# Patient Record
Sex: Male | Born: 1960 | Race: White | Hispanic: No | Marital: Married | Smoking: Never smoker
Health system: Southern US, Community
[De-identification: ages and names within clinical notes are randomized; demographics above are authoritative.]

## PROBLEM LIST (undated history)

## (undated) DIAGNOSIS — E119 Type 2 diabetes mellitus without complications: Secondary | ICD-10-CM

## (undated) DIAGNOSIS — I10 Essential (primary) hypertension: Secondary | ICD-10-CM

## (undated) DIAGNOSIS — G4733 Obstructive sleep apnea (adult) (pediatric): Secondary | ICD-10-CM

## (undated) DIAGNOSIS — T7840XA Allergy, unspecified, initial encounter: Secondary | ICD-10-CM

## (undated) DIAGNOSIS — M1990 Unspecified osteoarthritis, unspecified site: Secondary | ICD-10-CM

## (undated) DIAGNOSIS — Z9989 Dependence on other enabling machines and devices: Secondary | ICD-10-CM

## (undated) HISTORY — PX: NASAL SEPTUM SURGERY: SHX37

## (undated) HISTORY — DX: Obstructive sleep apnea (adult) (pediatric): G47.33

## (undated) HISTORY — DX: Unspecified osteoarthritis, unspecified site: M19.90

## (undated) HISTORY — DX: Dependence on other enabling machines and devices: Z99.89

## (undated) HISTORY — PX: ROTATOR CUFF REPAIR: SHX139

## (undated) HISTORY — DX: Allergy, unspecified, initial encounter: T78.40XA

## (undated) HISTORY — DX: Essential (primary) hypertension: I10

## (undated) HISTORY — PX: ANKLE FRACTURE SURGERY: SHX122

---

## 1998-07-09 ENCOUNTER — Emergency Department (HOSPITAL_COMMUNITY): Admission: EM | Admit: 1998-07-09 | Discharge: 1998-07-10 | Payer: Self-pay | Admitting: Internal Medicine

## 2001-03-29 ENCOUNTER — Emergency Department (HOSPITAL_COMMUNITY): Admission: EM | Admit: 2001-03-29 | Discharge: 2001-03-29 | Payer: Self-pay | Admitting: Emergency Medicine

## 2002-11-17 ENCOUNTER — Emergency Department (HOSPITAL_COMMUNITY): Admission: EM | Admit: 2002-11-17 | Discharge: 2002-11-17 | Payer: Self-pay | Admitting: Emergency Medicine

## 2002-11-17 ENCOUNTER — Encounter: Payer: Self-pay | Admitting: Emergency Medicine

## 2004-07-04 ENCOUNTER — Emergency Department (HOSPITAL_COMMUNITY): Admission: EM | Admit: 2004-07-04 | Discharge: 2004-07-04 | Payer: Self-pay | Admitting: Emergency Medicine

## 2010-08-07 ENCOUNTER — Encounter: Admission: RE | Admit: 2010-08-07 | Discharge: 2010-08-07 | Payer: Self-pay | Admitting: Internal Medicine

## 2011-01-22 HISTORY — PX: ROTATOR CUFF REPAIR: SHX139

## 2011-01-29 ENCOUNTER — Emergency Department (HOSPITAL_COMMUNITY)
Admission: EM | Admit: 2011-01-29 | Discharge: 2011-01-30 | Payer: Self-pay | Source: Home / Self Care | Admitting: Emergency Medicine

## 2011-01-29 LAB — CBC
HCT: 46.2 % (ref 39.0–52.0)
Hemoglobin: 15.7 g/dL (ref 13.0–17.0)
MCH: 31.8 pg (ref 26.0–34.0)
MCHC: 34 g/dL (ref 30.0–36.0)
MCV: 93.7 fL (ref 78.0–100.0)
Platelets: 246 10*3/uL (ref 150–400)
RBC: 4.93 MIL/uL (ref 4.22–5.81)
RDW: 12.8 % (ref 11.5–15.5)
WBC: 10.8 10*3/uL — ABNORMAL HIGH (ref 4.0–10.5)

## 2011-01-29 LAB — POCT CARDIAC MARKERS
CKMB, poc: 2.1 ng/mL (ref 1.0–8.0)
Myoglobin, poc: 62.2 ng/mL (ref 12–200)
Troponin i, poc: <0.05 ng/mL (ref 0.00–0.09)

## 2011-01-29 LAB — BASIC METABOLIC PANEL
CO2: 26 mEq/L (ref 19–32)
Calcium: 9.6 mg/dL (ref 8.4–10.5)
Creatinine, Ser: 0.93 mg/dL (ref 0.4–1.5)
Glucose, Bld: 202 mg/dL — ABNORMAL HIGH (ref 70–99)
Sodium: 142 mEq/L (ref 135–145)

## 2011-01-29 LAB — DIFFERENTIAL
Basophils Absolute: 0 10*3/uL (ref 0.0–0.1)
Basophils Relative: 0 % (ref 0–1)
Eosinophils Absolute: 0.5 10*3/uL (ref 0.0–0.7)
Eosinophils Relative: 5 % (ref 0–5)
Lymphocytes Relative: 15 % (ref 12–46)
Lymphs Abs: 1.7 10*3/uL (ref 0.7–4.0)
Monocytes Absolute: 0.9 10*3/uL (ref 0.1–1.0)
Monocytes Relative: 8 % (ref 3–12)
Neutro Abs: 7.7 10*3/uL (ref 1.7–7.7)
Neutrophils Relative %: 71 % (ref 43–77)

## 2011-05-11 ENCOUNTER — Other Ambulatory Visit: Payer: Self-pay | Admitting: Gastroenterology

## 2011-08-17 ENCOUNTER — Ambulatory Visit (AMBULATORY_SURGERY_CENTER): Payer: 59 | Admitting: *Deleted

## 2011-08-17 ENCOUNTER — Encounter: Payer: Self-pay | Admitting: Gastroenterology

## 2011-08-17 VITALS — Ht 71.0 in | Wt 223.6 lb

## 2011-08-17 DIAGNOSIS — Z1211 Encounter for screening for malignant neoplasm of colon: Secondary | ICD-10-CM

## 2011-08-17 MED ORDER — PEG-KCL-NACL-NASULF-NA ASC-C 100 G PO SOLR: ORAL | Status: DC

## 2011-08-31 ENCOUNTER — Other Ambulatory Visit: Payer: Self-pay | Admitting: Gastroenterology

## 2011-12-20 ENCOUNTER — Ambulatory Visit: Payer: Self-pay

## 2012-01-20 IMAGING — CT CT CERVICAL SPINE W/O CM
3 of 4 series · 16 of 28 positions shown, 18 images · non-contrast
Comparison: None.

CLINICAL DATA: Neck pain, paresthesias

CT CERVICAL SPINE WITHOUT CONTRAST
TECHNIQUE: Multidetector CT imaging of the cervical spine was
performed. Multiplanar CT image reconstructions were also
generated.

[Series 2: c spine bone · axial · 0.27mm/px · z∈[-32,+101]mm · 5 of 81 slices shown, 7 images]
[im 14/81  soft-tissue]
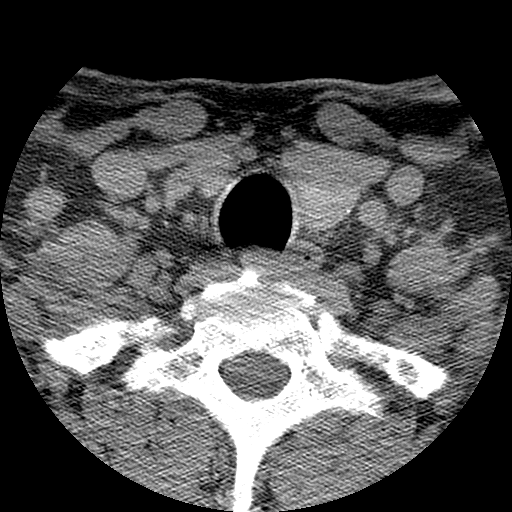
[im 14/81  bone]
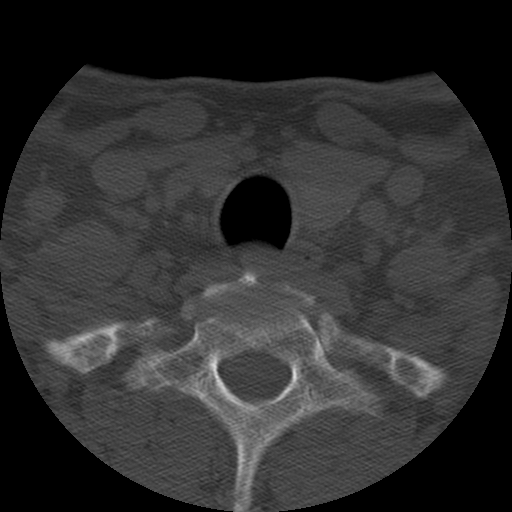
[im 27/81  bone]
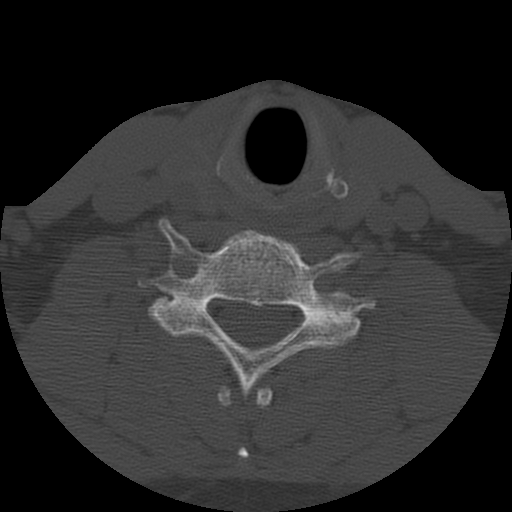
[im 41/81  bone]
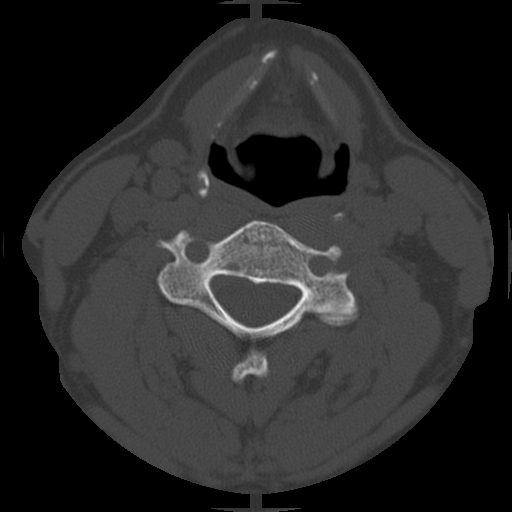
[im 54/81  bone]
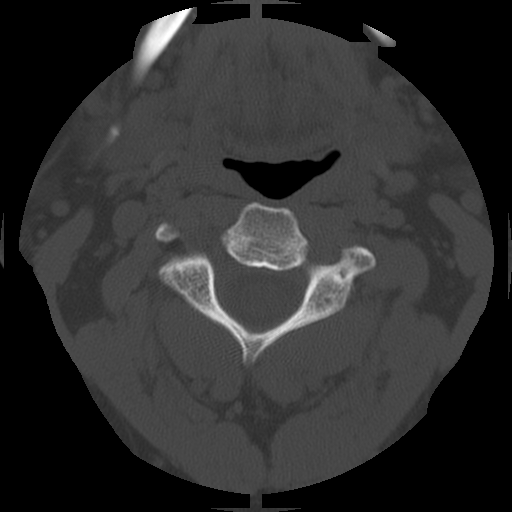
[im 67/81  soft-tissue]
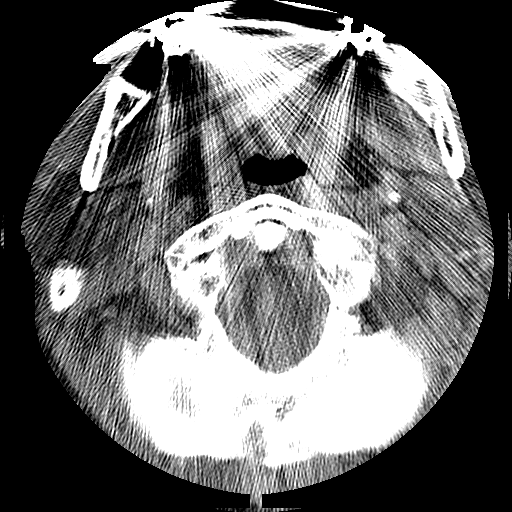
[im 67/81  bone]
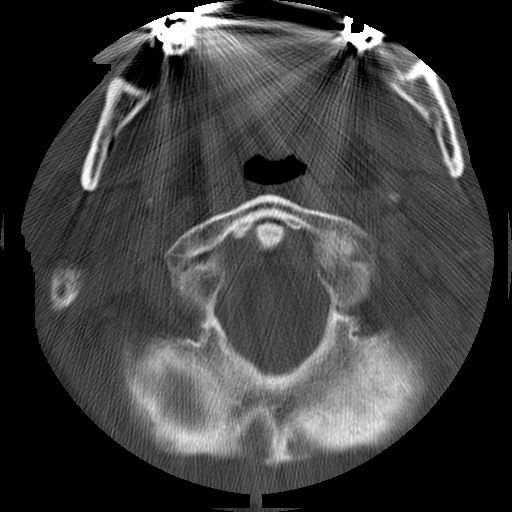

[Series 3: c spine soft · axial · 0.27mm/px · z∈[-22,+103]mm · 5 of 76 slices shown]
[im 13/76  soft-tissue]
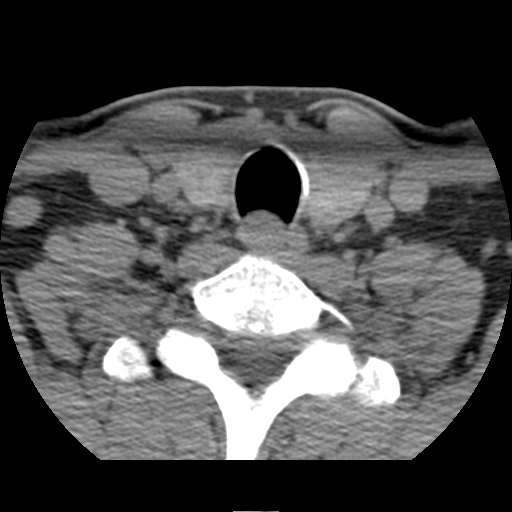
[im 26/76  soft-tissue]
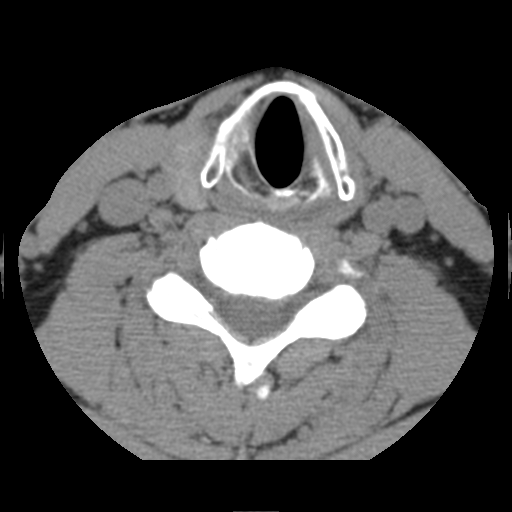
[im 38/76  soft-tissue]
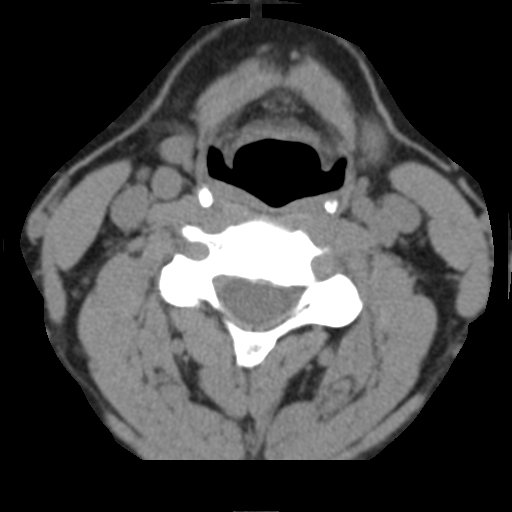
[im 51/76  soft-tissue]
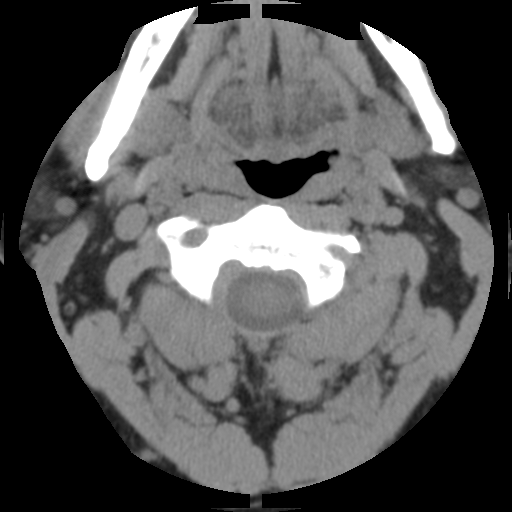
[im 63/76  soft-tissue]
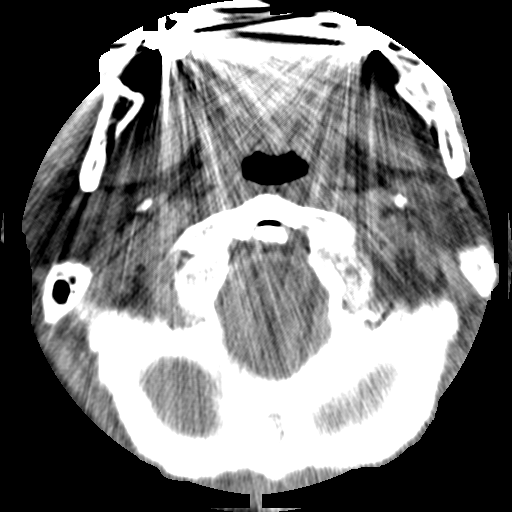

[Series 400: coronals · coronal · 0.40mm/px · 6 of 40 slices shown]
[im 2/40  soft-tissue]
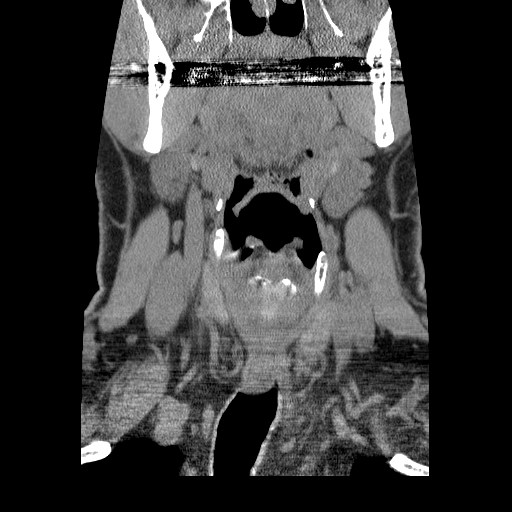
[im 7/40  bone]
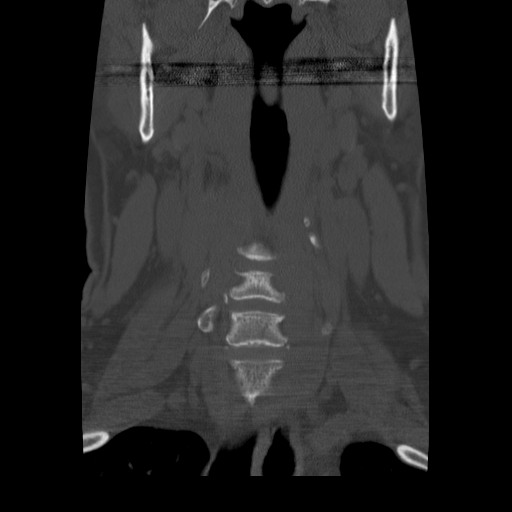
[im 14/40  bone]
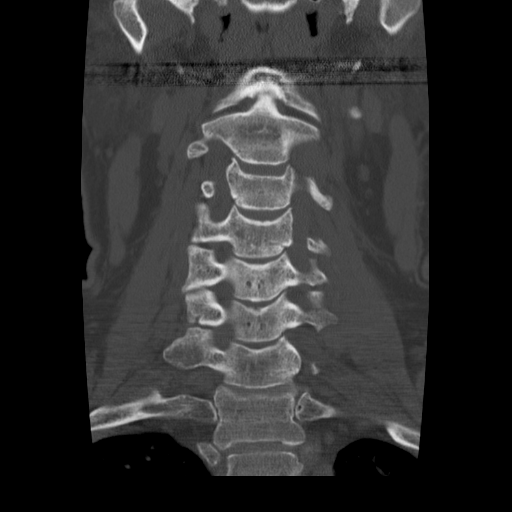
[im 20/40  bone]
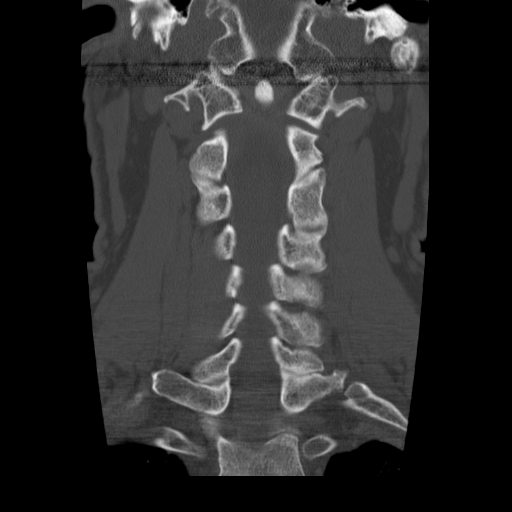
[im 27/40  bone]
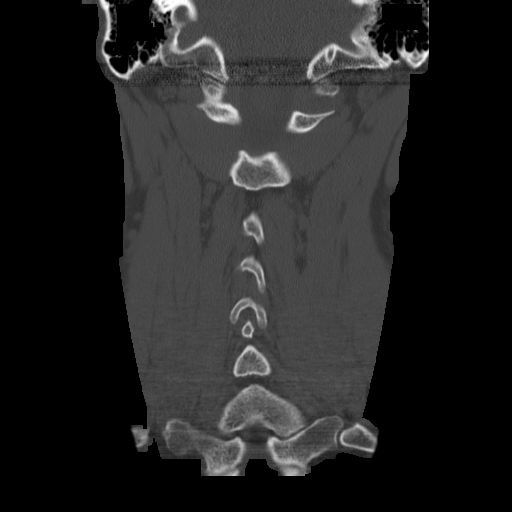
[im 33/40  bone]
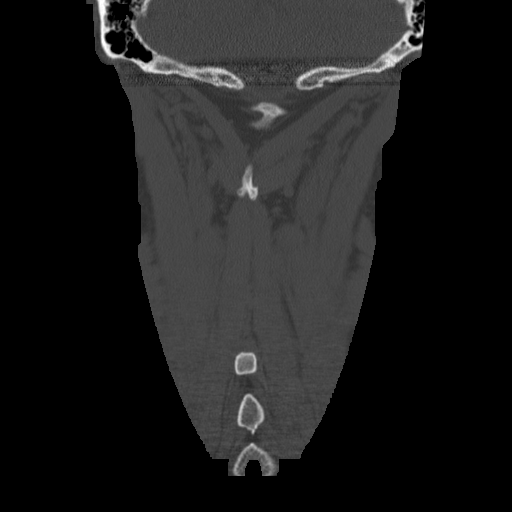

[16 of 28 positions shown; findings below may reference images not displayed]

FINDINGS: Normal alignment.  Preserve vertebral body heights and
disc spaces.  No fracture, malalignment or focal kyphosis.  Facets
aligned.  Foramina patent.  Degenerative changes of the C1-2
articulation anteriorly.
IMPRESSION: No acute fracture or malalignment.

MRI of the cervical spine without contrast would be more sensitive
for detection of degenerative disc disease.

## 2013-05-06 ENCOUNTER — Encounter: Payer: Self-pay | Admitting: Internal Medicine

## 2013-06-22 ENCOUNTER — Encounter: Payer: 59 | Admitting: Internal Medicine

## 2014-01-14 ENCOUNTER — Telehealth: Payer: Self-pay | Admitting: Neurology

## 2014-01-14 NOTE — Telephone Encounter (Addendum)
Marland Kitchen  Dr. Leanna Battles is referring Roberto Welch, 53 y.o. male, for an attended sleep study.  Wt: 211 lbs. Ht: 72 in. BMI: 28.65  Diagnoses: Snoring HTN Headache Fatigue Hyperlipidemia    Medication List: Current Outpatient Prescriptions  Medication Sig Dispense Refill   loratadine (CLARITIN) 10 MG tablet Take 10 mg by mouth daily.         MICARDIS 40 MG tablet Take 1 tablet by mouth Daily.       Multiple Vitamins-Minerals (OCUVITE PO) Take 1 tablet by mouth daily.         peg 3350 powder (MOVIPREP) 100 G SOLR MOVIPREP-TAKE AS DIRECTED  1 kit  0   verapamil (CALAN-SR) 240 MG CR tablet Take 1 tablet by mouth Daily.       No current facility-administered medications for this visit.    This patient presents to Dr. 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  Pt's spouse says blood pressure has been out of control for many years.  Reports chronic snoring, headaches, non-restorative sleep and fatigue upon awakening in the morning.  Unable to obtain an eds due to busy work schedule; lives in [place] but has to drive to [place] for work.  Dr. Philip Aspen would like to rule out osa as a contributing factor to the patients difficult to control bp.  Dr. Philip Aspen also notes that the patients overnight pulse oximetry done several years ago showed nocturnal hypoxia.  Insurance:  [employer] - prior approval is not required

## 2014-01-15 ENCOUNTER — Telehealth: Payer: Self-pay | Admitting: Neurology

## 2014-01-15 DIAGNOSIS — R0902 Hypoxemia: Secondary | ICD-10-CM

## 2014-01-15 DIAGNOSIS — R0683 Snoring: Secondary | ICD-10-CM

## 2014-01-15 DIAGNOSIS — Z024 Encounter for examination for driving license: Secondary | ICD-10-CM

## 2014-01-15 DIAGNOSIS — R51 Headache: Secondary | ICD-10-CM

## 2014-01-15 DIAGNOSIS — R519 Headache, unspecified: Secondary | ICD-10-CM

## 2014-01-15 DIAGNOSIS — I10 Essential (primary) hypertension: Secondary | ICD-10-CM

## 2014-01-15 NOTE — Telephone Encounter (Signed)
DOT driver with headaches, EDS and snoring , poorly controlled blood pressure on meds. Dr Eloise HarmanPaterson referred for Sleep evaluation. SPLIT study will be ordered.

## 2014-01-19 ENCOUNTER — Other Ambulatory Visit: Payer: Self-pay | Admitting: Neurology

## 2014-02-05 ENCOUNTER — Ambulatory Visit (INDEPENDENT_AMBULATORY_CARE_PROVIDER_SITE_OTHER): Payer: BC Managed Care – PPO

## 2014-02-05 DIAGNOSIS — Z024 Encounter for examination for driving license: Secondary | ICD-10-CM

## 2014-02-05 DIAGNOSIS — G4733 Obstructive sleep apnea (adult) (pediatric): Secondary | ICD-10-CM

## 2014-02-05 DIAGNOSIS — R0902 Hypoxemia: Secondary | ICD-10-CM

## 2014-02-05 DIAGNOSIS — G4730 Sleep apnea, unspecified: Secondary | ICD-10-CM

## 2014-02-05 DIAGNOSIS — R519 Headache, unspecified: Secondary | ICD-10-CM

## 2014-02-05 DIAGNOSIS — I10 Essential (primary) hypertension: Secondary | ICD-10-CM

## 2014-02-05 DIAGNOSIS — R0683 Snoring: Secondary | ICD-10-CM

## 2014-02-05 DIAGNOSIS — R51 Headache: Secondary | ICD-10-CM

## 2014-02-05 DIAGNOSIS — G4710 Hypersomnia, unspecified: Secondary | ICD-10-CM

## 2014-02-10 ENCOUNTER — Institutional Professional Consult (permissible substitution): Payer: Self-pay | Admitting: Neurology

## 2014-02-16 ENCOUNTER — Telehealth: Payer: Self-pay | Admitting: Neurology

## 2014-02-16 ENCOUNTER — Encounter: Payer: Self-pay | Admitting: *Deleted

## 2014-02-16 DIAGNOSIS — G4733 Obstructive sleep apnea (adult) (pediatric): Secondary | ICD-10-CM

## 2014-02-16 NOTE — Telephone Encounter (Signed)
I called and left a message for the patient about his recent sleep study results.  I informed the patient that the study confirmed the diagnosis of obstructive sleep apnea and that Dr.Dohmeier recommends CPAP therapy. This means another overnight study for titration and mask fitting and to callback to the office to schedule this appointment. I will fax a copy of this report to Dr.[name] and mail a copy to the patient.

## 2014-03-19 ENCOUNTER — Ambulatory Visit (INDEPENDENT_AMBULATORY_CARE_PROVIDER_SITE_OTHER): Payer: Self-pay

## 2014-03-19 DIAGNOSIS — G4733 Obstructive sleep apnea (adult) (pediatric): Secondary | ICD-10-CM

## 2014-03-19 DIAGNOSIS — Z0289 Encounter for other administrative examinations: Secondary | ICD-10-CM

## 2014-04-01 ENCOUNTER — Telehealth: Payer: Self-pay | Admitting: Neurology

## 2014-04-01 ENCOUNTER — Encounter: Payer: Self-pay | Admitting: *Deleted

## 2014-04-01 DIAGNOSIS — G4733 Obstructive sleep apnea (adult) (pediatric): Secondary | ICD-10-CM

## 2014-04-01 NOTE — Telephone Encounter (Signed)
error 

## 2014-04-01 NOTE — Telephone Encounter (Signed)
I called and spoke with the patient's spouse about his CPAP titration study results. I informed the patient's spouse that her husband did well on CPAP during the night of his study with significant improvement in respiratory events. Also Dr. Vickey Hugerohmeier recommends CPAP therapy at home and so will I fax the order to [facility] who is in [facility] and if the patient's spouse thinks the cost to high, I will try [facility]. I will fax a copy of the report to Dr. Silvano RuskPaterson's office and mail a copy to the patient with a follow instruction letter.

## 2014-05-14 ENCOUNTER — Encounter: Payer: Self-pay | Admitting: Gastroenterology

## 2014-07-16 ENCOUNTER — Encounter: Payer: Self-pay | Admitting: Neurology

## 2014-07-16 ENCOUNTER — Encounter (INDEPENDENT_AMBULATORY_CARE_PROVIDER_SITE_OTHER): Payer: Self-pay

## 2014-07-16 ENCOUNTER — Ambulatory Visit: Payer: BC Managed Care – PPO

## 2014-07-16 ENCOUNTER — Ambulatory Visit (INDEPENDENT_AMBULATORY_CARE_PROVIDER_SITE_OTHER): Payer: BC Managed Care – PPO | Admitting: Neurology

## 2014-07-16 VITALS — Ht 70.5 in | Wt 223.5 lb

## 2014-07-16 VITALS — BP 150/98 | HR 84 | Ht 70.0 in | Wt 223.5 lb

## 2014-07-16 DIAGNOSIS — Z1211 Encounter for screening for malignant neoplasm of colon: Secondary | ICD-10-CM

## 2014-07-16 DIAGNOSIS — G4733 Obstructive sleep apnea (adult) (pediatric): Secondary | ICD-10-CM | POA: Insufficient documentation

## 2014-07-16 DIAGNOSIS — Z9989 Dependence on other enabling machines and devices: Principal | ICD-10-CM

## 2014-07-16 MED ORDER — MOVIPREP 100 G PO SOLR: 1.0000 | Freq: Once | ORAL | Status: DC

## 2014-07-16 NOTE — Patient Instructions (Signed)
Sleep Apnea  Sleep apnea is a sleep disorder characterized by abnormal pauses in breathing while you sleep. When your breathing pauses, the level of oxygen in your blood decreases. This causes you to move out of deep sleep and into light sleep. As a result, your quality of sleep is poor, and the system that carries your blood throughout your body (cardiovascular system) experiences stress. If sleep apnea remains untreated, the following conditions can develop:  High blood pressure (hypertension).  Coronary artery disease.  Inability to achieve or maintain an erection (impotence).  Impairment of your thought process (cognitive dysfunction). There are three types of sleep apnea: 1. Obstructive sleep apnea--Pauses in breathing during sleep because of a blocked airway. 2. Central sleep apnea--Pauses in breathing during sleep because the area of the brain that controls your breathing does not send the correct signals to the muscles that control breathing. 3. Mixed sleep apnea--A combination of both obstructive and central sleep apnea. RISK FACTORS The following risk factors can increase your risk of developing sleep apnea:  Being overweight.  Smoking.  Having narrow passages in your nose and throat.  Being of older age.  Being male.  Alcohol use.  Sedative and tranquilizer use.  Ethnicity. Among individuals younger than 35 years, African Americans are at increased risk of sleep apnea. SYMPTOMS   Difficulty staying asleep.  Daytime sleepiness and fatigue.  Loss of energy.  Irritability.  Loud, heavy snoring.  Morning headaches.  Trouble concentrating.  Forgetfulness.  Decreased interest in sex. DIAGNOSIS  In order to diagnose sleep apnea, your caregiver will perform a physical examination. Your caregiver may suggest that you take a home sleep test. Your caregiver may also recommend that you spend the night in a sleep lab. In the sleep lab, several monitors record  information about your heart, lungs, and brain while you sleep. Your leg and arm movements and blood oxygen level are also recorded. TREATMENT The following actions may help to resolve mild sleep apnea:  Sleeping on your side.   Using a decongestant if you have nasal congestion.   Avoiding the use of depressants, including alcohol, sedatives, and narcotics.   Losing weight and modifying your diet if you are overweight. There also are devices and treatments to help open your airway:  Oral appliances. These are custom-made mouthpieces that shift your lower jaw forward and slightly open your bite. This opens your airway.  Devices that create positive airway pressure. This positive pressure "splints" your airway open to help you breathe better during sleep. The following devices create positive airway pressure:  Continuous positive airway pressure (CPAP) device. The CPAP device creates a continuous level of air pressure with an air pump. The air is delivered to your airway through a mask while you sleep. This continuous pressure keeps your airway open.  Nasal expiratory positive airway pressure (EPAP) device. The EPAP device creates positive air pressure as you exhale. The device consists of single-use valves, which are inserted into each nostril and held in place by adhesive. The valves create very little resistance when you inhale but create much more resistance when you exhale. That increased resistance creates the positive airway pressure. This positive pressure while you exhale keeps your airway open, making it easier to breath when you inhale again.  Bilevel positive airway pressure (BPAP) device. The BPAP device is used mainly in patients with central sleep apnea. This device is similar to the CPAP device because it also uses an air pump to deliver continuous air pressure   through a mask. However, with the BPAP machine, the pressure is set at two different levels. The pressure when you  exhale is lower than the pressure when you inhale.  Surgery. Typically, surgery is only done if you cannot comply with less invasive treatments or if the less invasive treatments do not improve your condition. Surgery involves removing excess tissue in your airway to create a wider passage way. Document Released: 12/07/2002 Document Revised: 04/13/2013 Document Reviewed: 04/24/2012 ExitCare Patient Information 2015 [employer]. This information is not intended to replace advice given to you by your health care provider. Make sure you discuss any questions you have with your health care provider.  

## 2014-07-16 NOTE — Progress Notes (Signed)
[facility]   Provider:  Melvyn Novas, MD  Referring Provider: Jarome Matin, MD Primary Care Physician:  [name], MD   [facility]   HPI:  Roberto Welch is a 53 y.o. male , who is seen here as a referral  from Dr. Eloise Harman for  a sleep consultation after sleep study.   A meeting today Roberto Welch, was present in the and the company of his wife. Roberto Welch underwent 2 sleep studies first a diagnostic polysomnography on-6-skin which revealed an AHI of 18.6 and second and respiratory disturbance index of 20.6 equal a months off REM sleep and non-REM sleep dependent apnea were seen but REM sleep accounted for less than 20% of the night.  REM AHI was 54.3 non-REM sleep AHI was 11.5 severe supine AHI was 24.5 and nonsupine sleep position AHI 3.6.  Lowest oxygen saturation was 75% for a total duration of 42.5 minutes, the patient was asked to return for a CPAP titration,  having missed the cutoff line for which his insurance allowed split protocol.  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 He reports that he had to pick up his machine at the office and that he had not had any further instructions on how to use it.  His compliance reporting for the last 70 days : 76% compliance / average time of use 6 hours and 34 minutes/  average use at home for all Days 4 hours and 58 minutes/  set at 9 cm water - residual AHI is 3.1.  Roberto Welch culture that regularly at 9:30 and falls asleep promptly he arises in the morning at 5 AM, he relies on an alarm. He usually has normal sleep interruption and therefore gets an average of over 7 hours of nocturnal sleep. He does not have any nocturia.  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  The patient reports that he has not been comfortable with the machine it seems to be less the pressure thats bothersome than the nasal interface , ESON -  Demonstrating how he uses his  nasal interface I can see that he is developing pressure marks on his upper lip and that the mask seems to be too small. He has reportedly less headaches since using CPAP.    Review of Systems: Out of a complete 14 system review, the patient complains of only the following symptoms, and all other reviewed systems are negative. Epworth  FSS  "I am still fatigued in my daytime "feels not better since he used CPAP.     History   Social History  . Marital Status: Married    Spouse Name: [name]    Number of Children: 0  . Years of Education: 14   Occupational History  . Not on file.   Social History Main Topics  . Smoking status: Never Smoker   . Smokeless tobacco: Never Used  . Alcohol Use: No  . Drug Use: No  . Sexual Activity:    Other Topics Concern  . 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   Patient is working full-time.  Patient has a college education.   Patient is right-handed.   Patient drinks three cans of soda daily.    Family History  Problem Relation Age of Onset  . Lung cancer Father   . Colon polyps Neg Hx   . Colon cancer Neg Hx     Past Medical History  Diagnosis Date  . Hypertension   . Allergy   . Arthritis     Past Surgical History  Procedure Laterality Date  . Rotator cuff repair  01-22-11    LEFT  . Rotator cuff repair  8 YRS AGO     RIGHT  . Ankle fracture surgery  15 YRS AGO    RIGHT  . Nasal septum surgery  12 YRS AGO    Current Outpatient Prescriptions  Medication Sig Dispense Refill  . loratadine (CLARITIN) 10 MG tablet Take 10 mg by mouth daily.        Marland Kitchen MICARDIS 40 MG tablet Take 1 tablet by mouth Daily.      . verapamil (CALAN-SR) 240 MG CR tablet Take 1 tablet by  mouth Daily.       No current facility-administered medications for this visit.    Allergies as of 07/16/2014 - Review Complete 07/16/2014  Allergen Reaction Noted  . Codeine Nausea And Vomiting 08/17/2011  . Morphine and related Nausea And Vomiting 08/17/2011    Vitals: BP 150/98  Pulse 84  Ht 5\' 10"  (1.778 m)  Wt 223 lb 8 oz (101.379 kg)  BMI 32.07 kg/m2 Last Weight:  Wt Readings from Last 1 Encounters:  07/16/14 223 lb 8 oz (101.379 kg)   Last Height:   Ht Readings from Last 1 Encounters:  07/16/14 5\' 10"  (1.778 m)    Physical exam:  General: The patient is awake, alert and appears not in acute distress. The patient is well groomed. Head: Normocephalic, atraumatic. Neck is supple. Mallampati 3 , neck circumference:17 inches , no TMJ elongated and low uvula.  Able to breath through the nose.  Cardiovascular:  Regular rate and rhythm without  murmurs or carotid bruit, and without distended neck veins. Respiratory: Lungs are clear to auscultation. Skin:  Without evidence of edema, or rash Trunk: BMI is  elevated and patient  has normal posture.  Neurologic exam : The patient is awake and alert, oriented to place and time.  Memory subjective described as intact. There is a normal attention span & concentration ability.  Speech is fluent without   dysarthria, dysphonia or aphasia. Mood and affect are appropriate.  Cranial nerves: Pupils are equal and briskly reactive to light. Funduscopic exam without  evidence of pallor or edema.  Extraocular movements  in vertical and horizontal planes intact and without nystagmus. Visual fields by finger perimetry are intact. Hearing to finger rub intact.  Facial sensation intact to fine touch. Facial motor strength is symmetric and tongue and uvula move midline.  Motor exam:  Normal tone and normal muscle bulk and symmetric normal strength in all extremities.  Sensory:  Fine touch, pinprick and vibration were tested in all extremities.   Proprioception is tested in the upper extremities only. This was  normal.  Coordination: Rapid alternating movements in the fingers/hands is tested and normal.  Finger-to-nose maneuver tested and normal without evidence of ataxia, dysmetria or tremor.  Gait and station: Patient walks without assistive device .  Strength within normal limits. Stance is stable and normal.  Deep tendon reflexes: in the  upper and lower extremities are symmetric and intact.  Babinski maneuver response is  downgoing.   Assessment:  After physical and neurologic examination, review of laboratory studies, imaging, neurophysiology testing and pre-existing records, assessment is  1) OSA with hypoxemia. Treated on CPAP 9 cm water, both conditions improved. Patient is compliant , but barely- he has been unhappy with the mask fit ,  I suggested to change to a nasal pillow.  2) allow for EPR of 2  or airsense   Plan:  Treatment plan and additional workup :  [employer] is current DME to refit mask / headgear in supine position and adjust EPR to 2 cm.   I fitted him with an AIRFIT P10 NASAL MEDIUM PILLOW.

## 2014-07-16 NOTE — Progress Notes (Signed)
No allergies to eggs or soy No past problems with anesthesia No diet/weight loss meds No home oxygen  Has email  Emmi instructions given for colonoscopy 

## 2014-07-30 ENCOUNTER — Ambulatory Visit: Payer: BC Managed Care – PPO | Admitting: Gastroenterology

## 2014-07-30 ENCOUNTER — Encounter: Payer: Self-pay | Admitting: Gastroenterology

## 2014-07-30 VITALS — BP 113/68 | HR 64 | Temp 98.3°F | Resp 22 | Ht 70.5 in | Wt 223.0 lb

## 2014-07-30 DIAGNOSIS — Z1211 Encounter for screening for malignant neoplasm of colon: Secondary | ICD-10-CM

## 2014-07-30 MED ORDER — SODIUM CHLORIDE 0.9 % IV SOLN: 500.0000 mL | INTRAVENOUS | Status: DC

## 2014-07-30 NOTE — Patient Instructions (Signed)

## 2014-07-30 NOTE — Op Note (Signed)
[facility] [address] [place], [id number]   COLONOSCOPY PROCEDURE REPORT  PATIENT: Roberto Welch, Roberto E.  MR#: [id number] BIRTHDATE: Jul 09, 1961 , 53  yrs. old GENDER: Male ENDOSCOPIST: Meryl DareMalcolm T Yoon Barca, MD, [facility] REFERRED AO:[id number]:[name], [name] PROCEDURE DATE:  07/30/2014 PROCEDURE:   Colonoscopy, screening First Screening Colonoscopy - Avg.  risk and is 50 yrs.  old or older Yes.  Prior Negative Screening - Now for repeat screening. N/A  History of Adenoma - Now for follow-up colonoscopy & has been > or = to 3 yrs.  N/A  Polyps Removed Today? No.  Recommend repeat exam, <10 yrs? No. ASA CLASS:   Class II INDICATIONS:average risk screening. MEDICATIONS: MAC sedation, administered by CRNA and propofol (Diprivan) 230mg  IV DESCRIPTION OF PROCEDURE:   After the risks benefits and alternatives of the procedure were thoroughly explained, informed consent was obtained.  A digital rectal exam revealed no abnormalities of the rectum.   The LB [id number]  endoscope was introduced through the anus and advanced to the cecum, which was identified by both the appendix and ileocecal valve. No adverse events experienced.   The quality of the prep was good, using MoviPrep  The instrument was then slowly withdrawn as the colon was fully examined.  COLON FINDINGS: A normal appearing cecum, ileocecal valve, and appendiceal orifice were identified.  The ascending, hepatic flexure, transverse, splenic flexure, descending, sigmoid colon and rectum appeared unremarkable.  No polyps or cancers were seen. Retroflexed views revealed no abnormalities. The time to cecum=1 minutes 31 seconds.  Withdrawal time=9 minutes 29 seconds.  The scope was withdrawn and the procedure completed.  COMPLICATIONS: There were no complications.  ENDOSCOPIC IMPRESSION: 1.  Normal colon  RECOMMENDATIONS: 1.  Continue to follow colorectal cancer screening guidelines for "routine risk" patients  with a repeat colonoscopy in 10 years. There is no need for routine, screening FOBT (stool) testing for at least 5 years.  eSigned:  Meryl DareMalcolm T Seab Axel, MD, [facility] 07/30/2014 10:59 AM

## 2014-07-30 NOTE — Progress Notes (Signed)
Report to PACU, RN, vss, BBS= Clear.  

## 2014-08-02 ENCOUNTER — Telehealth: Payer: Self-pay | Admitting: *Deleted

## 2014-08-02 NOTE — Telephone Encounter (Signed)
  Follow up Call-  Call back number 07/30/2014  Post procedure Call Back phone  # [phone]  wife's cell  Permission to leave phone message Yes     Patient questions:  Do you have a fever, pain , or abdominal swelling? No. Pain Score  0 *  Have you tolerated food without any problems? Yes.    Have you been able to return to your normal activities? Yes.    Do you have any questions about your discharge instructions: Diet   No. Medications  No. Follow up visit  No.  Do you have questions or concerns about your Care? No.  Actions: * If pain score is 4 or above: No action needed, pain <4.  Spoke with spouse, She states he is back to work and doing well.

## 2014-11-24 ENCOUNTER — Ambulatory Visit: Payer: BC Managed Care – PPO | Admitting: Neurology

## 2015-01-07 ENCOUNTER — Ambulatory Visit (INDEPENDENT_AMBULATORY_CARE_PROVIDER_SITE_OTHER): Payer: BLUE CROSS/BLUE SHIELD | Admitting: Neurology

## 2015-01-07 ENCOUNTER — Encounter: Payer: Self-pay | Admitting: Neurology

## 2015-01-07 VITALS — BP 144/90 | HR 68 | Resp 14 | Wt 224.0 lb

## 2015-01-07 DIAGNOSIS — R5382 Chronic fatigue, unspecified: Secondary | ICD-10-CM | POA: Insufficient documentation

## 2015-01-07 DIAGNOSIS — G4710 Hypersomnia, unspecified: Secondary | ICD-10-CM | POA: Insufficient documentation

## 2015-01-07 DIAGNOSIS — G4733 Obstructive sleep apnea (adult) (pediatric): Secondary | ICD-10-CM

## 2015-01-07 DIAGNOSIS — G4730 Sleep apnea, unspecified: Secondary | ICD-10-CM

## 2015-01-07 DIAGNOSIS — Z9989 Dependence on other enabling machines and devices: Secondary | ICD-10-CM

## 2015-01-07 MED ORDER — ARMODAFINIL 250 MG PO TABS: ORAL_TABLET | ORAL | Status: DC

## 2015-01-07 NOTE — Addendum Note (Signed)
Addended by: Melvyn NovasHMEIER, Courvoisier Hamblen on: 01/07/2015 12:19 PM   Modules accepted: Orders

## 2015-01-07 NOTE — Progress Notes (Signed)
[facility]  Provider:  Melvyn Novas, MD  Referring Provider: Jarome Matin, MD Primary Care Physician:  [name], MD   [facility]   HPI:  Roberto Welch is a 54 y.o. male , who is seen here as a referral  from Dr. Eloise Harman for  a sleep consultation after sleep study.   A meeting today Roberto Welch, was present in the and the company of his wife. Roberto Welch underwent 2 sleep studies first a diagnostic polysomnography on-6-skin which revealed an AHI of 18.6 and second and respiratory disturbance index of 20.6 equal a months off REM sleep and non-REM sleep dependent apnea were seen but REM sleep accounted for less than 20% of the night.  REM AHI was 54.3 non-REM sleep AHI was 11.5 severe supine AHI was 24.5 and nonsupine sleep position AHI 3.6.  Lowest oxygen saturation was 75% for a total duration of 42.5 minutes, the patient was asked to return for a CPAP titration,  having missed the cutoff line for which his insurance allowed split protocol.  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 He reports that he had to pick up his machine at the office and that he had not had any further instructions on how to use it.  His compliance reporting for the last 70 days : 76% compliance / average time of use 6 hours and 34 minutes/  average use at home for all Days 4 hours and 58 minutes/  set at 9 cm water - residual AHI is 3.1. Roberto Welch culture that regularly at 9:30 and falls asleep promptly he arises in the morning at 5 AM, he relies on an alarm. He usually has normal sleep interruption and therefore gets an average of over 7 hours of nocturnal sleep. He does not have any nocturia.  He is sometimes on back up call for his company , he may get an emergency call - that this is not routinely or regularly the  case. The patient reports that he has not been comfortable with the machine it seems to be less the pressure thats bothersome than the nasal interface , ESON -  Demonstrating how he uses his  nasal interface I can see that he is developing pressure marks on his upper lip and that the mask seems to be too small. He has reportedly less headaches since using CPAP.    01-07-15 This is a follow-up visit for Roberto Welch who has no no acute problems but still complains after compliant CPAP use of fatigue and high sleepiness and daytime. I was able to interrogate his machine and the tracings show me a 7 hour and 33 minute average daily use of CPAP he has a 87% compliance in a 30 day download he did use the machine only 1 day less than 4 hours. He also was not able to use the machine Christmas eve and Christmas Day. At 9 cm water with EPR of 1 cm water and his wrist the residual apnea index is 1.3 his AHI is 1.7 he still has high air leaks in spite of the low apnea count is using a nasal pillow which has been more comfortable than the mask and interface he has tried first. He reports that he has trouble getting entangled with a 2 or the ear holes.  There is no problems with condensation water. His wife reports that she can hear the air leaks and that it is a loud rushing sound as if water leaks. She usually not with him and he will readjust the mask. She has not reported any breakthrough snoring or breakthrough apnea since he's been on the machine.  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  When I would like to do first is to make the use of CPAP still easier for him I would like him to try a dream where mask with a holes enters from the crown and he has no for had contact with the head gear. This may help the getting entangled heart. In addition since his fatigue severity scale is still so high at 53 I would show consider  prescribing Nuvigil in spite of his condition of hypertension. I think that the fatigue score limits his quality of life and his ability to participate in social activities.   Review of Systems: Out of a complete 14 system review, the patient complains of only the following symptoms, and all other reviewed systems are negative. Epworth 10 FSS  53 "I am still fatigued in my daytime "feels still no refreshed on CPAP "    History   Social History  . Marital Status: Married    Spouse Name: [name]    Number of Children: 0  . Years of Education: 14   Occupational History  . Not on file.   Social History Main Topics  . Smoking status: Never Smoker   . Smokeless tobacco: Never Used  . Alcohol Use: No  . Drug Use: No  . Sexual Activity: Not on file   Other Topics Concern  . 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   Patient is working full-time.   Patient has a college education.   Patient is right-handed.   Patient drinks three cans of soda daily.    Family History  Problem Relation Age of Onset  . Lung cancer Father   . Colon polyps Neg Hx   . Colon cancer Neg Hx   . Pancreatic cancer Neg Hx   . Rectal cancer Neg Hx   . Stomach cancer Neg Hx     Past Medical History  Diagnosis Date  . Hypertension   . Allergy   . Arthritis     Past Surgical History  Procedure Laterality Date  . Rotator cuff repair  01-22-11    LEFT  . Rotator cuff repair  8 YRS AGO     RIGHT  . Ankle fracture surgery  15 YRS AGO    RIGHT  . Nasal septum surgery  12 YRS AGO    Current Outpatient Prescriptions  Medication Sig Dispense Refill  . loratadine (CLARITIN) 10 MG tablet Take 10 mg by mouth daily.      . meloxicam (MOBIC) 15 MG tablet     . pravastatin (PRAVACHOL) 40 MG tablet     . telmisartan (MICARDIS) 40 MG tablet Take 40 mg by mouth daily.     . verapamil (CALAN-SR) 240 MG CR tablet Take 1 tablet by mouth Daily.      No current facility-administered medications for this visit.    Allergies as of 01/07/2015 - Review Complete 01/07/2015  Allergen Reaction Noted  . Codeine Nausea  And Vomiting 08/17/2011  . Morphine and related Nausea And Vomiting 08/17/2011    Vitals: BP 144/90 mmHg  Pulse 68  Resp 14  Wt 224 lb (101.606 kg) Last Weight:  Wt Readings from Last 1 Encounters:  01/07/15 224 lb (101.606 kg)   Last Height:   Ht Readings from Last 1 Encounters:  07/30/14 5' 10.5" (1.791 m)    Physical exam:  General: The patient is awake, alert and appears not in acute distress. The patient is well groomed.  Head: Normocephalic, atraumatic. Neck is supple.  Mallampati 3 , neck circumference:16.75  , no TMJ elongated and low uvula.  Able to breath through the nose.  Cardiovascular:  Regular rate and rhythm without  murmurs or carotid bruit, and without distended neck veins. Respiratory: Lungs are clear to auscultation. Skin:  Without evidence of edema, or rash -  Trunk: BMI is elevated and patient  has normal posture.  Neurologic exam : The patient is awake and alert, oriented to place and time.   Memory subjective described as intact. There is a normal attention span & concentration ability.  Speech is fluent without   dysarthria, dysphonia or aphasia. Mood and affect are frustrated . Cranial nerves: Pupils are equal and briskly reactive to light. Extraocular movements  in vertical and horizontal planes intact and without nystagmus. Visual fields by finger perimetry are intact. Hearing to finger rub intact. Facial motor strength is symmetric and tongue and uvula move midline.  Tongue protrusion is equal into both cheeks.   Motor exam: Normal tone and  bulk and symmetric normal strength in all extremities.  Sensory:  Fine touch, pinprick and vibration were tested in all extremities.  Proprioception is tested in the upper extremities only. This was normal.  Coordination: Rapid alternating  movements in the fingers/hands is tested and normal.  Finger-to-nose maneuver tested and normal without evidence of ataxia, dysmetria or tremor.  Gait and station: Patient walks without assistive device .   Strength within normal limits. Stance is stable and normal.  Deep tendon reflexes: in the  upper and lower extremities are symmetric and intact. Babinski maneuver response is  downgoing.   Assessment:  After physical and neurologic examination, review of laboratory studies, imaging, neurophysiology testing and pre-existing records, assessment is  Residual fatigue is still high and BP remains high on CPAP with f good compliance.   I will order a refit for a dream ware mask from [employer]>  will start on Nuvigil for fatigue treatment and  Prescription will follow. Expiration 4 -17 [id number].   1) OSA with hypoxemia.  Treated on CPAP 9 cm water, both conditions improved. Considering the patient's high degree of fatigue I have given him a 7 day sample of Nuvigil 250 mg but I would like him for the introductory phase to take only half a pill explained. He can expect 8-10 hours of wakefulness and less fatigue from this medication he will not discontinue his CPAP as he still needs to treat his sleep apnea. I expect him to do best on Nuvigil if he doesn't consume caffeine at the same time. 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 Patient is now CPAP - he has been unhappy with the mask fit before,  I suggested to change to a nasal pillow.  Can [employer] provide a change to dream ware mask.? He feels entangled and he  is very , very stubborn.   2) allow for EPR of 2  Cm or airsense   Plan:  Treatment plan and additional workup :  [employer] is current DME to refit mask / headgear in supine position and adjust EPR to 2 cm.  Nuvigil 250 mg , devided  Bid.  CC Dr. Eloise Harman.

## 2015-01-07 NOTE — Patient Instructions (Signed)

## 2015-01-27 ENCOUNTER — Telehealth: Payer: Self-pay | Admitting: Neurology

## 2015-01-27 ENCOUNTER — Telehealth: Payer: Self-pay

## 2015-01-27 NOTE — Telephone Encounter (Signed)
Pt's wife is calling stating that the pharmacy needs authorization for Armodafinil 250 MG tablet.  The pharmacy [place] [facility] [place]. Please call and advise.

## 2015-01-27 NOTE — Telephone Encounter (Signed)
[facility] has approved the request for coverage on Nuvigil effective until 01/27/2016 Ref PA# [employer] [facility] [id number]

## 2015-01-27 NOTE — Telephone Encounter (Signed)
I have contacted ins and provided all clinical info.  Request is currently under review.  I called back.  They are aware.

## 2015-09-14 ENCOUNTER — Telehealth: Payer: Self-pay | Admitting: Neurology

## 2015-09-14 DIAGNOSIS — G4710 Hypersomnia, unspecified: Secondary | ICD-10-CM

## 2015-09-14 DIAGNOSIS — G4733 Obstructive sleep apnea (adult) (pediatric): Secondary | ICD-10-CM

## 2015-09-14 DIAGNOSIS — Z9989 Dependence on other enabling machines and devices: Secondary | ICD-10-CM

## 2015-09-14 DIAGNOSIS — G4730 Sleep apnea, unspecified: Principal | ICD-10-CM

## 2015-09-14 DIAGNOSIS — R5382 Chronic fatigue, unspecified: Secondary | ICD-10-CM

## 2015-09-14 MED ORDER — ARMODAFINIL 250 MG PO TABS: ORAL_TABLET | ORAL | Status: DC

## 2015-09-14 NOTE — Telephone Encounter (Signed)
Rx processed.

## 2015-09-14 NOTE — Telephone Encounter (Signed)
Patient's wife is calling to get a refill called to [facility] in [place] for Nuvigil  for the patient.  The patient's wife says [facility] has requested a refill. Thank you.

## 2016-01-13 ENCOUNTER — Ambulatory Visit: Payer: BLUE CROSS/BLUE SHIELD | Admitting: Neurology

## 2016-01-31 ENCOUNTER — Ambulatory Visit (INDEPENDENT_AMBULATORY_CARE_PROVIDER_SITE_OTHER): Payer: BLUE CROSS/BLUE SHIELD | Admitting: Neurology

## 2016-01-31 ENCOUNTER — Encounter: Payer: Self-pay | Admitting: Neurology

## 2016-01-31 VITALS — BP 142/94 | HR 88 | Resp 20 | Ht 71.0 in | Wt 220.0 lb

## 2016-01-31 DIAGNOSIS — G4733 Obstructive sleep apnea (adult) (pediatric): Secondary | ICD-10-CM | POA: Diagnosis not present

## 2016-01-31 DIAGNOSIS — G4710 Hypersomnia, unspecified: Secondary | ICD-10-CM | POA: Diagnosis not present

## 2016-01-31 DIAGNOSIS — R5382 Chronic fatigue, unspecified: Secondary | ICD-10-CM | POA: Diagnosis not present

## 2016-01-31 DIAGNOSIS — R51 Headache: Secondary | ICD-10-CM

## 2016-01-31 DIAGNOSIS — R519 Headache, unspecified: Secondary | ICD-10-CM | POA: Insufficient documentation

## 2016-01-31 DIAGNOSIS — G4730 Sleep apnea, unspecified: Secondary | ICD-10-CM | POA: Diagnosis not present

## 2016-01-31 DIAGNOSIS — Z9989 Dependence on other enabling machines and devices: Secondary | ICD-10-CM

## 2016-01-31 MED ORDER — ARMODAFINIL 250 MG PO TABS: 250.0000 mg | ORAL_TABLET | Freq: Every day | ORAL | Status: DC

## 2016-01-31 NOTE — Progress Notes (Signed)
[facility]  Provider:  Melvyn Novas, MD  Referring Provider: Jarome Matin, MD Primary Care Physician:  Roberto Fillers, MD   [facility]   HPI:  Roberto Welch is a 55 y.o. male , who is seen here as a referral  from Dr. Eloise Welch for  a sleep consultation after sleep study.   A meeting today Roberto Welch, was present in the and the company of his wife. Roberto Welch underwent 2 sleep studies first a diagnostic polysomnography on-6-skin which revealed an AHI of 18.6 and second and respiratory disturbance index of 20.6 equal a months off REM sleep and non-REM sleep dependent apnea were seen but REM sleep accounted for less than 20% of the night.  REM AHI was 54.3 non-REM sleep AHI was 11.5 severe supine AHI was 24.5 and nonsupine sleep position AHI 3.6.  Lowest oxygen saturation was 75% for a total duration of 42.5 minutes, the patient was asked to return for a CPAP titration,  having missed the cutoff line for which his insurance allowed split protocol.  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 Roberto Welch is now followed by Roberto Welch ,  medical equipment company . He reports that he had to pick up his machine at the office and that he had not had any further instructions on how to use it.  His compliance reporting for the last 70 days : 76% compliance / average time of use 6 hours and 34 minutes/  average use at home for all Days 4 hours and 58 minutes/  set at 9 cm water - residual AHI is 3.1. Roberto Welch culture that regularly at 9:30 and falls asleep promptly he arises in the morning at 5 AM, he relies on an alarm. He usually has normal sleep interruption and therefore gets an average of over 7 hours of nocturnal sleep. He does not have any nocturia.  He is sometimes on back up call for his company , he may get an emergency call - that this is not routinely or regularly the  case. The patient reports that he has not been comfortable with the machine it seems to be less the pressure thats bothersome than the nasal interface , ESON -  Demonstrating how he uses his  nasal interface I can see that he is developing pressure marks on his upper lip and that the mask seems to be too small. He has reportedly less headaches since using CPAP.    01-07-15 This is a follow-up visit for Roberto Welch who has no no acute problems but still complains after compliant CPAP use of fatigue and high sleepiness and daytime. I was able to interrogate his machine and the tracings show me a 7 hour and 33 minute average daily use of CPAP he has a 87% compliance in a 30 day download he did use the machine only 1 day less than 4 hours. He also was not able to use the machine Christmas eve and Christmas Day. At 9 cm water with EPR of 1 cm water and his wrist the residual apnea index is 1.3 his AHI is 1.7 he still has high air leaks in spite of the low apnea count is using a nasal pillow which has been more comfortable than the mask and interface he has tried first. He reports that he has trouble getting entangled with a 2 or the ear holes.  There is no problems with condensation water. His wife reports that she can hear the air leaks and that it is a loud rushing sound as if water leaks. She usually not with him and he will readjust the mask. She has not reported any breakthrough snoring or breakthrough apnea since he's been on the machine.  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  When I would like to do first is to make the use of CPAP still easier for him I would like him to try a dream where mask with a holes enters from the crown and he has no for had contact with the head gear. This may help the getting entangled heart. In addition since his fatigue severity scale is still so high at 53 I would show consider  prescribing Nuvigil in spite of his condition of hypertension. I think that the fatigue score limits his quality of life and his ability to participate in social activities. [employer] is current DME to refit mask / headgear in supine position and adjust EPR to 2 cm.  Nuvigil 250 mg , devided  Bid.   01-31-16, This is a yearly compliance follow-up for Roberto Welch. He has used a CPAP with 100% compliance for number of days and 93% compliance for number of days was over 4 hours of use. Average user time is 6 hours 54 minutes the machine is set at 9 cm water pressure was 170 m EPR, the residual AHI is 2.2. This is an excellent result and he has been very compliant. He reports however waking up with headaches that he does not feel at the time he went to bed. His Epworth sleepiness score is 10 but is also influenced by the use of modafinil. Fatigue severity score was 34 points on modafinil. His wife has noticed that he acts differently when he does not take Provigil. Roberto Welch describes her husband as irritable and excessively daytime sleepy when not on the medication. 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     Review of Systems: Out of a complete 14 system review, the patient complains of only the following symptoms, and all other reviewed systems are negative. Epworth 10 again.  FSS   34 from 53 "I am still fatigued in my daytime "feels still no refreshed on CPAP " uses Modafinil, morning headache  Involving the forehead and temple , area above the cheekbones.     Social History   Social History  . Marital Status: Married    Spouse Name: Roberto Welch  . Number of Children: 0  . Years of Education: 14   Occupational History  . Not on file.   Social History Main Topics  . Smoking status: Never Smoker   . Smokeless tobacco: Never Used  . Alcohol Use: No  . Drug Use: No  . Sexual Activity: Not on file   Other Topics Concern  . 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   Patient is working full-time.   Patient has a college education.   Patient is right-handed.   Patient drinks three cans of soda daily.    Family History  Problem Relation Age of Onset  . Lung cancer Father   . Colon polyps Neg Hx   .  Colon cancer Neg Hx   . Pancreatic cancer Neg Hx   . Rectal cancer Neg Hx   . Stomach cancer Neg Hx     Past Medical History  Diagnosis Date  . Hypertension   . Allergy   . Arthritis   . OSA on CPAP     Past Surgical History  Procedure Laterality Date  . Rotator cuff repair  01-22-11    LEFT  . Rotator cuff repair  8 YRS AGO     RIGHT  . Ankle fracture surgery  15 YRS AGO    RIGHT  . Nasal septum surgery  12 YRS AGO    Current Outpatient Prescriptions  Medication Sig Dispense Refill  . Armodafinil 250 MG tablet Use 1/2 tab in AM and 1/2 tab at lunch time . (Patient taking differently: 250 mg daily. Pt is taking brand name nuvigil, whole tablet in the am.) 30 tablet 5  . loratadine (CLARITIN) 10 MG tablet Take 10 mg by mouth daily.      . meloxicam (MOBIC) 15 MG tablet     . pravastatin (PRAVACHOL) 40 MG tablet     . telmisartan (MICARDIS) 40 MG tablet Take 40 mg by mouth daily.     . verapamil (CALAN-SR) 240 MG CR tablet Take 1 tablet by mouth Daily.     No current facility-administered medications for this visit.    Allergies as of 01/31/2016 - Review Complete 01/31/2016  Allergen Reaction Noted  . Codeine Nausea And Vomiting 08/17/2011  . Morphine and related Nausea And Vomiting 08/17/2011    Vitals: BP 142/94 mmHg  Pulse 88  Resp 20  Ht 5\' 11"  (1.803 m)  Wt 220 lb (99.791 kg)  BMI 30.70 kg/m2 Last Weight:  Wt Readings from Last 1 Encounters:  01/31/16 220 lb (99.791 kg)   Last Height:   Ht Readings from Last 1 Encounters:  01/31/16 5\' 11"  (1.803 m)    Physical exam:  General: The patient is awake, alert and appears not in  acute distress. The patient is well groomed.  Head: Normocephalic, atraumatic. Neck is supple.  Mallampati 3 , neck circumference:16.75 , no TMJ elongated and low uvula.  Able to breath through the nose.  Cardiovascular:  Regular rate and rhythm without  murmurs or carotid bruit, and without distended neck veins. Respiratory: Lungs are clear to auscultation. Skin:  Without evidence of edema, or rash -  Trunk: BMI is elevated and patient  has normal posture.  Neurologic exam : The patient is awake and alert, oriented to place and time.   Memory subjective described as intact. There is a normal attention span & concentration ability.  Speech is fluent without  dysarthria, dysphonia or aphasia. Mood and affect are frustrated . Cranial nerves: Pupils are equal and briskly reactive to light. Extraocular movements  in vertical and horizontal planes intact and without nystagmus. Visual fields by finger perimetry are intact.Hearing to finger rub intact. Facial motor strength is symmetric and tongue and uvula move midline. Tongue protrusion is equal into both cheeks.  Motor exam: Normal tone and  bulk and symmetric normal strength in all extremities.   Assessment:  After physical and neurologic examination, review of laboratory studies, imaging, neurophysiology testing and pre-existing records, assessment is  Residual fatigue is still high and BP remains high on CPAP with  93%  compliance.   I will order a refit for a dream waer mask from [employer], I will ask for an auto titration.  1) OSA with hypoxemia. Needs auto-titration setting 5-12 cm water.  Treated on CPAP 9 cm water, both conditions improved.  Considering the patient's high degree of fatigue I have given him a refill of Nuvigil 250 mg .  He can expect 8-10 hours of wakefulness and less fatigue from this medication he will not discontinue his CPAP as he still needs to treat his sleep apnea.      Cc Dr Roberto Welch.  Lana Flaim,  MD

## 2016-03-12 ENCOUNTER — Other Ambulatory Visit: Payer: Self-pay | Admitting: Neurology

## 2016-03-12 ENCOUNTER — Telehealth: Payer: Self-pay

## 2016-03-12 NOTE — Telephone Encounter (Signed)
Pa for nuvigil approved by [facility]. 03/08/2016-03/08/2017. PA- [facility] [phone]

## 2016-03-13 NOTE — Telephone Encounter (Signed)
Faxed RX for nuvigil to pt's [facility]. Received a receipt of confirmation.

## 2016-10-02 ENCOUNTER — Other Ambulatory Visit: Payer: Self-pay

## 2016-10-02 MED ORDER — ARMODAFINIL 250 MG PO TABS: ORAL_TABLET | ORAL | 0 refills | Status: DC

## 2016-10-02 NOTE — Telephone Encounter (Signed)
RX for armodafinil faxed to [facility]. Received a receipt of confirmation.

## 2017-01-26 ENCOUNTER — Encounter (HOSPITAL_COMMUNITY): Payer: Self-pay

## 2017-01-26 ENCOUNTER — Emergency Department (HOSPITAL_COMMUNITY)
Admission: EM | Admit: 2017-01-26 | Discharge: 2017-01-27 | Disposition: A | Discharge status: Home / Self Care | Payer: BLUE CROSS/BLUE SHIELD | Attending: Emergency Medicine | Admitting: Emergency Medicine

## 2017-01-26 DIAGNOSIS — Y999 Unspecified external cause status: Secondary | ICD-10-CM | POA: Insufficient documentation

## 2017-01-26 DIAGNOSIS — M545 Low back pain, unspecified: Secondary | ICD-10-CM

## 2017-01-26 DIAGNOSIS — I10 Essential (primary) hypertension: Secondary | ICD-10-CM | POA: Insufficient documentation

## 2017-01-26 DIAGNOSIS — W01198A Fall on same level from slipping, tripping and stumbling with subsequent striking against other object, initial encounter: Secondary | ICD-10-CM | POA: Insufficient documentation

## 2017-01-26 DIAGNOSIS — Z7982 Long term (current) use of aspirin: Secondary | ICD-10-CM | POA: Diagnosis not present

## 2017-01-26 DIAGNOSIS — R748 Abnormal levels of other serum enzymes: Secondary | ICD-10-CM | POA: Diagnosis not present

## 2017-01-26 DIAGNOSIS — Y939 Activity, unspecified: Secondary | ICD-10-CM | POA: Insufficient documentation

## 2017-01-26 DIAGNOSIS — E860 Dehydration: Secondary | ICD-10-CM | POA: Diagnosis not present

## 2017-01-26 DIAGNOSIS — R55 Syncope and collapse: Secondary | ICD-10-CM | POA: Insufficient documentation

## 2017-01-26 DIAGNOSIS — R7989 Other specified abnormal findings of blood chemistry: Secondary | ICD-10-CM

## 2017-01-26 DIAGNOSIS — Y929 Unspecified place or not applicable: Secondary | ICD-10-CM | POA: Insufficient documentation

## 2017-01-26 LAB — BASIC METABOLIC PANEL
ANION GAP: 9 (ref 5–15)
BUN: 16 mg/dL (ref 6–20)
CALCIUM: 8.9 mg/dL (ref 8.9–10.3)
CO2: 24 mmol/L (ref 22–32)
Chloride: 107 mmol/L (ref 101–111)
Creatinine, Ser: 1.4 mg/dL — ABNORMAL HIGH (ref 0.61–1.24)
GFR, EST NON AFRICAN AMERICAN: 55 mL/min — AB (ref >60)
Glucose, Bld: 258 mg/dL — ABNORMAL HIGH (ref 65–99)
POTASSIUM: 3.8 mmol/L (ref 3.5–5.1)
Sodium: 140 mmol/L (ref 135–145)

## 2017-01-26 LAB — CBG MONITORING, ED: GLUCOSE-CAPILLARY: 244 mg/dL — AB (ref 65–99)

## 2017-01-26 LAB — CBC
HCT: 39.3 % (ref 39.0–52.0)
HEMOGLOBIN: 13.7 g/dL (ref 13.0–17.0)
MCH: 32.5 pg (ref 26.0–34.0)
MCHC: 34.9 g/dL (ref 30.0–36.0)
MCV: 93.1 fL (ref 78.0–100.0)
Platelets: 162 10*3/uL (ref 150–400)
RBC: 4.22 MIL/uL (ref 4.22–5.81)
RDW: 13.1 % (ref 11.5–15.5)
WBC: 10.5 10*3/uL (ref 4.0–10.5)

## 2017-01-26 NOTE — ED Notes (Signed)
Bed: WA10 Expected date:  Expected time:  Means of arrival:  Comments: ems 

## 2017-01-26 NOTE — ED Provider Notes (Signed)
[facility]-EMERGENCY DEPT Provider Note   CSN: [id number] Arrival date & time: 01/26/17  2228  By signing my name below, I, Javier Docker, attest that this documentation has been prepared under the direction and in the presence of [employer], PA-C. Electronically Signed: Javier Docker, ER Scribe. 08/11/2016. 12:04 AM.  History   Chief Complaint Chief Complaint  Patient presents with  . Fall  . Loss of Consciousness   The history is provided by the patient, the spouse and medical records. No language interpreter was used.   HPI Comments: Roberto Welch is a 56 y.o. male who presents to the Emergency Department complaining of LOC and fall this afternoon. He also endorses muscle cramping in his posterior thighs and two events of loose stools this afternoon. He states he drank very little liquids today. He endorses current left-sided lower back pain. He states he fell this afternoon while blowing leaves with a backpack blower when he tripped over a stick. He fell directly back onto the blower. Several hours later he had a LOC after having a BM. He was found by his wife on the floor face down outside the bathroom. He then got up and passed out again and fell onto his chest. He denies head injury, CP, vision changes, HA, abdominal pain, numbness, tingling, saddle anesthesia or weakness in extremities or hip pain. He has no PMHx of back issues or DM. He has no past hx of abdominal or cardiac surgery. He denies black or tarry stools or coffee grounds. Nursing note states the patient was incontinent of stool however patient was very clear that he needed to defecate and wife would not let him To go to the bathroom again. He was not involuntarily incontinent.     Past Medical History:  Diagnosis Date  . Allergy   . Arthritis   . Hypertension   . OSA on CPAP     Patient Active Problem List   Diagnosis Date Noted  . Sleep related headaches 01/31/2016  . Chronic fatigue 01/07/2015  .  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 Hypersomnia with sleep apnea 01/07/2015  . OSA on CPAP 07/16/2014    Past Surgical History:  Procedure Laterality Date  . ANKLE FRACTURE SURGERY  15 YRS AGO   RIGHT  . NASAL SEPTUM SURGERY  12 YRS AGO  . ROTATOR CUFF REPAIR  01-22-11   LEFT  . ROTATOR CUFF REPAIR  8 YRS AGO    RIGHT       Home Medications    Prior to Admission medications   Medication Sig Start Date End Date Taking? Authorizing Provider  Armodafinil 250 MG tablet Take 1/2 tablet by mouth every morning and every day at lunchtime. Patient taking differently: Take 250 mg by mouth daily.  10/02/16  Yes Carmen Dohmeier, MD  Aspirin-Caffeine (BC FAST PAIN RELIEF ARTHRITIS) 1000-65 MG PACK Take 2 Packages by mouth every 6 (six) hours as needed. Pain   Yes Historical Provider, MD  loratadine (CLARITIN) 10 MG tablet Take 10 mg by mouth daily.     Yes Historical Provider, MD  meloxicam (MOBIC) 15 MG tablet Take 15 mg by mouth daily.  11/04/14  Yes Historical Provider, MD  pravastatin (PRAVACHOL) 40 MG tablet Take 40 mg by mouth daily.  12/08/14  Yes Historical Provider, MD  telmisartan (MICARDIS) 40 MG tablet Take 40 mg by mouth daily.  12/21/14  Yes Historical Provider, MD  verapamil (CALAN-SR) 240 MG CR tablet Take 240 mg by mouth Daily.  08/03/11  Yes Historical Provider, MD  methocarbamol (ROBAXIN) 500 MG tablet Take 1 tablet (500 mg total) by mouth 2 (two) times daily. 01/27/17   Eulala Newcombe, PA-C  naproxen (NAPROSYN) 500 MG tablet Take 1 tablet (500 mg total) by mouth 2 (two) times daily with a meal. 01/27/17   Dahlia Client Itza Maniaci, PA-C    Family History Family History  Problem Relation Age of Onset  . Lung cancer Father   . Colon polyps Neg Hx   . Colon cancer Neg Hx   . Pancreatic cancer Neg Hx   . Rectal cancer Neg Hx   . Stomach cancer Neg Hx     Social History Social History  Substance Use Topics  . Smoking status: Never Smoker  . Smokeless tobacco: Never Used    . Alcohol use No     Allergies   Codeine and Morphine and related   Review of Systems Review of Systems  Musculoskeletal: Positive for back pain.  Neurological: Positive for syncope.  All other systems reviewed and are negative.    Physical Exam Updated Vital Signs BP 118/66 (BP Location: Left Arm)   Pulse 78   Resp 24   SpO2 97%   Physical Exam  Constitutional: He is oriented to person, place, and time. He appears well-developed and well-nourished. No distress.  HENT:  Head: Normocephalic and atraumatic.  Nose: Nose normal.  Mouth/Throat: Uvula is midline, oropharynx is clear and moist and mucous membranes are normal.  Eyes: Conjunctivae and EOM are normal.  Neck: No spinous process tenderness and no muscular tenderness present. No neck rigidity. Normal range of motion present.  Full ROM without pain No midline cervical tenderness No crepitus, deformity or step-offs No paraspinal tenderness  Cardiovascular: Normal rate, regular rhythm and intact distal pulses.   Pulses:      Radial pulses are 2+ on the right side, and 2+ on the left side.       Dorsalis pedis pulses are 2+ on the right side, and 2+ on the left side.       Posterior tibial pulses are 2+ on the right side, and 2+ on the left side.  Pulmonary/Chest: Effort normal and breath sounds normal. No accessory muscle usage. No respiratory distress. He has no decreased breath sounds. He has no wheezes. He has no rhonchi. He has no rales. He exhibits no tenderness and no bony tenderness.  No eccymosis or contusion.  No flail segment, crepitus or deformity Equal chest expansion  Abdominal: Soft. Normal appearance and bowel sounds are normal. There is no tenderness. There is no rigidity, no guarding and no CVA tenderness.  No ecchymosis or contusion.  Abd soft and nontender  Musculoskeletal: Normal range of motion.  ROM of the t-spine and L-spine not tested No tenderness to palpation of the spinous processes of  the T-spine or L-spine No crepitus, deformity or step-offs Mild tenderness to palpation of the left paraspinous muscles of the L-spine and SI joint  Lymphadenopathy:    He has no cervical adenopathy.  Neurological: He is alert and oriented to person, place, and time. No cranial nerve deficit. GCS eye subscore is 4. GCS verbal subscore is 5. GCS motor subscore is 6.  Speech is clear and goal oriented, follows commands Normal 5/5 strength in upper and lower extremities bilaterally including dorsiflexion and plantar flexion, strong and equal grip strength Sensation normal to light and sharp touch Moves extremities without ataxia, coordination intact Gait testing deferred No Clonus  Skin: Skin is  warm and dry. No rash noted. He is not diaphoretic. No erythema.  Psychiatric: He has a normal mood and affect.  Nursing note and vitals reviewed.    ED Treatments / Results  DIAGNOSTIC STUDIES: Oxygen Saturation is 97% on RA, normal by my interpretation.    COORDINATION OF CARE: 12:07 AM Discussed treatment plan with pt at bedside and pt agreed to plan.  Labs (all labs ordered are listed, but only abnormal results are displayed) Labs Reviewed  BASIC METABOLIC PANEL - Abnormal; Notable for the following:       Result Value   Glucose, Bld 258 (*)    Creatinine, Ser 1.40 (*)    GFR calc non Af Amer 55 (*)    All other components within normal limits  URINALYSIS, ROUTINE W REFLEX MICROSCOPIC - Abnormal; Notable for the following:    Glucose, UA >=500 (*)    Ketones, ur 5 (*)    All other components within normal limits  CBG MONITORING, ED - Abnormal; Notable for the following:    Glucose-Capillary 244 (*)    All other components within normal limits  CBC    EKG  EKG Interpretation  Date/Time:  Saturday January 26 2017 22:34:37 EST Ventricular Rate:  80 PR Interval:    QRS Duration: 86 QT Interval:  396 QTC Calculation: 457 R Axis:   74 Text Interpretation:  Normal sinus rhythm  Abnormal inferior Q waves tachycardia resolved, otherwise similar to 2012 Confirmed by GOLDSTON MD, SCOTT [id number]) on 01/26/2017 11:00:30 PM       Radiology Dg Lumbar Spine Complete  Result Date: 01/27/2017 CLINICAL DATA:  Initial evaluation for acute trauma, fall. Low back pain radiating to left hip. EXAM: LUMBAR SPINE - COMPLETE 4+ VIEW COMPARISON:  None. FINDINGS: Vertebral bodies normally aligned with preservation of the normal lumbar lordosis. No listhesis. Vertebral body heights maintained. No fracture. Degenerative spondylosis noted at T11-12 and T12-L1, with more mild degenerative changes within the remainder of the lumbar spine. Sacrum intact. SI joints approximated. No soft tissue abnormality. IMPRESSION: 1. No radiographic evidence for acute abnormality within the lumbar spine. 2. Mild multilevel degenerative spondylolysis, most notable at T11-12 and T12-L1. Electronically Signed   By: Rise Mu [name]   On: 01/27/2017 01:38   Dg Pelvis 1-2 Views  Result Date: 01/27/2017 CLINICAL DATA:  Initial evaluation for acute trauma, fall. EXAM: PELVIS - 1-2 VIEW COMPARISON:  None. FINDINGS: There is no evidence of pelvic fracture or diastasis. N degenerative osteoarthritic change pes about the hips bilaterally. No soft tissue abnormality. IMPRESSION: No acute osseous abnormality identified within the pelvis. Electronically Signed   By: Rise Mu [name]   On: 01/27/2017 01:39   Ct Head Wo Contrast  Result Date: 01/27/2017 CLINICAL DATA:  Initial evaluation for acute trauma, fall the EXAM: CT HEAD WITHOUT CONTRAST CT CERVICAL SPINE WITHOUT CONTRAST TECHNIQUE: Multidetector CT imaging of the head and cervical spine was performed following the standard protocol without intravenous contrast. Multiplanar CT image reconstructions of the cervical spine were also generated. COMPARISON:  Prior CT from 08/07/2010. FINDINGS: CT HEAD FINDINGS Brain: Cerebral volume within normal limits for  patient age. No evidence for acute intracranial hemorrhage. No findings to suggest acute large vessel territory infarct. No mass lesion, midline shift, or mass effect. Ventricles are normal in size without evidence for hydrocephalus. No extra-axial fluid collection identified. Vascular: No hyperdense vessel identified. Skull: Scalp soft tissues demonstrate no acute abnormality.Calvarium intact. Sinuses/Orbits: Globes and orbital soft tissues are within normal  limits. Visualized paranasal sinuses are clear. No mastoid effusion. CT CERVICAL SPINE FINDINGS Alignment: Vertebral bodies normally aligned with preservation of the normal cervical lordosis. No listhesis. Skull base and vertebrae: Skullbase intact. Normal C1-2 articulations preserved. Dens is intact. Vertebral body heights maintained. No acute fracture. Soft tissues and spinal canal: Visualized soft tissues of the neck within normal limits. Minimal plaque noted about the carotid bifurcations. No prevertebral edema. Disc levels:  Mild degenerate spondylolysis noted at C5-6 and C6-7. Upper chest: Visualized upper chest demonstrates no acute abnormality. No apical pneumothorax. IMPRESSION: 1. No acute intracranial process identified. 2. No acute traumatic injury within the cervical spine. Electronically Signed   By: Rise MuBenjamin  McClintock [name]   On: 01/27/2017 01:48   Ct Cervical Spine Wo Contrast  Result Date: 01/27/2017 CLINICAL DATA:  Initial evaluation for acute trauma, fall the EXAM: CT HEAD WITHOUT CONTRAST CT CERVICAL SPINE WITHOUT CONTRAST TECHNIQUE: Multidetector CT imaging of the head and cervical spine was performed following the standard protocol without intravenous contrast. Multiplanar CT image reconstructions of the cervical spine were also generated. COMPARISON:  Prior CT from 08/07/2010. FINDINGS: CT HEAD FINDINGS Brain: Cerebral volume within normal limits for patient age. No evidence for acute intracranial hemorrhage. No findings to suggest  acute large vessel territory infarct. No mass lesion, midline shift, or mass effect. Ventricles are normal in size without evidence for hydrocephalus. No extra-axial fluid collection identified. Vascular: No hyperdense vessel identified. Skull: Scalp soft tissues demonstrate no acute abnormality.Calvarium intact. Sinuses/Orbits: Globes and orbital soft tissues are within normal limits. Visualized paranasal sinuses are clear. No mastoid effusion. CT CERVICAL SPINE FINDINGS Alignment: Vertebral bodies normally aligned with preservation of the normal cervical lordosis. No listhesis. Skull base and vertebrae: Skullbase intact. Normal C1-2 articulations preserved. Dens is intact. Vertebral body heights maintained. No acute fracture. Soft tissues and spinal canal: Visualized soft tissues of the neck within normal limits. Minimal plaque noted about the carotid bifurcations. No prevertebral edema. Disc levels:  Mild degenerate spondylolysis noted at C5-6 and C6-7. Upper chest: Visualized upper chest demonstrates no acute abnormality. No apical pneumothorax. IMPRESSION: 1. No acute intracranial process identified. 2. No acute traumatic injury within the cervical spine. Electronically Signed   By: Rise MuBenjamin  McClintock [name]   On: 01/27/2017 01:48    Procedures Procedures (including critical care time)  Medications Ordered in ED Medications  sodium chloride 0.9 % bolus 1,000 mL (0 mLs Intravenous Stopped 01/27/17 0458)     Initial Impression / Assessment and Plan / ED Course  I have reviewed the triage vital signs and the nursing notes.  Pertinent labs & imaging results that were available during my care of the patient were reviewed by me and considered in my medical decision making (see chart for details).     Patient presents after syncope which occurred several hours after fall. Patient denies headache, chest pain, diaphoresis.  His only current complaint is left low back pain. Labs and imaging are  reassuring. He does have an elevated serum creatinine from baseline. Also elevated blood glucose 244. No elevated anion gap. Patient given fluids. Patient without orthostasis. He ambulates here in the emergency department with steady gait and without feelings of dizziness, lightheadedness or near syncope. Unknown etiology of patient's syncope. Question possible dehydration versus vagal. Patient without arrhythmia or tachycardia while here in the department.  Patient without history of congestive heart failure, normal hematocrit, no shortness of breath and systolic blood pressure greater than 90; patient is low risk. EKG with Q  waves however no old for comparison. Will plan for discharge home with close PCP follow-up.  Possibility of recurrent syncope has been discussed. I discussed reasons to avoid driving until PCP followup and other safety preventions including use of ladders and working at heights.   Pt has remained hemodynamically stable throughout their time in the ED  BP 116/79   Pulse 62   Temp 97.7 F (36.5 C) (Oral)   Resp 25   Ht 5\' 11"  (1.803 m)   Wt 95.3 kg   SpO2 98%   BMI 29.29 kg/m    Orthostatic VS for the past 24 hrs:  BP- Lying Pulse- Lying BP- Sitting Pulse- Sitting BP- Standing at 0 minutes Pulse- Standing at 0 minutes  01/27/17 0302 127/71 60 130/85 70 115/78 71    The patient was discussed with Dr. Criss Alvine who agrees with the treatment plan.    Final Clinical Impressions(s) / ED Diagnoses   Final diagnoses:  Syncope and collapse  Dehydration  Elevated serum creatinine  Acute left-sided low back pain without sciatica    New Prescriptions Discharge Medication List as of 01/27/2017  4:50 AM    START taking these medications   Details  naproxen (NAPROSYN) 500 MG tablet Take 1 tablet (500 mg total) by mouth 2 (two) times daily with a meal., Starting Sun 01/27/2017, Print         I personally performed the services described in this documentation, which was  scribed in my presence. The recorded information has been reviewed and is accurate.         Dahlia Client Karly Pitter, PA-C 01/27/17 1610    Pricilla Loveless, MD 02/01/17 2350

## 2017-01-26 NOTE — ED Triage Notes (Addendum)
Pt BIB [facility] from home. C/o 2 falls today. 1st fall, pt tripped and hit his back on a log. 2nd fall about 1.5 hours ago. He was in the bathroom, got dizzy, passed out and fell. Pt had fecal incontinence and required EMS to get up from that fall. Pt states that he was doing yardwork all day and did not drink very much.  given en route. (20 RAC). A&Ox4.

## 2017-01-26 NOTE — ED Notes (Signed)
Asked pt if he could give a urine sample, but he said he is not able to.

## 2017-01-27 ENCOUNTER — Emergency Department (HOSPITAL_COMMUNITY): Payer: BLUE CROSS/BLUE SHIELD

## 2017-01-27 LAB — URINALYSIS, ROUTINE W REFLEX MICROSCOPIC
BACTERIA UA: NONE SEEN
Bilirubin Urine: NEGATIVE
Glucose, UA: >=500 mg/dL — AB
Hgb urine dipstick: NEGATIVE
KETONES UR: 5 mg/dL — AB
Leukocytes, UA: NEGATIVE
Nitrite: NEGATIVE
PH: 5 (ref 5.0–8.0)
Protein, ur: NEGATIVE mg/dL
SQUAMOUS EPITHELIAL / LPF: NONE SEEN
Specific Gravity, Urine: 1.029 (ref 1.005–1.030)

## 2017-01-27 MED ORDER — METHOCARBAMOL 500 MG PO TABS: 500.0000 mg | ORAL_TABLET | Freq: Two times a day (BID) | ORAL | 0 refills | Status: DC

## 2017-01-27 MED ORDER — NAPROXEN 500 MG PO TABS: 500.0000 mg | ORAL_TABLET | Freq: Two times a day (BID) | ORAL | 0 refills | Status: DC

## 2017-01-27 MED ORDER — SODIUM CHLORIDE 0.9 % IV BOLUS (SEPSIS)
1000.0000 mL | Freq: Once | INTRAVENOUS | Status: AC
  Administered 2017-01-27: 1000 mL via INTRAVENOUS

## 2017-01-27 NOTE — Discharge Instructions (Signed)
1. Medications: robaxin, naproxyn, usual home medications 2. Treatment: rest, drink plenty of fluids, gentle stretching as discussed, alternate ice and heat 3. Follow Up: Please followup with your primary doctor in 1-2 days for discussion of your diagnoses, recheck of lab work and further evaluation after today's visit; if you do not have a primary care doctor use the resource guide provided to find one;  Return to the ER for worsening back pain, difficulty walking, loss of bowel or bladder control or other concerning symptoms

## 2017-01-28 ENCOUNTER — Encounter: Payer: Self-pay | Admitting: Neurology

## 2017-01-30 ENCOUNTER — Encounter: Payer: Self-pay | Admitting: Neurology

## 2017-01-30 ENCOUNTER — Ambulatory Visit (INDEPENDENT_AMBULATORY_CARE_PROVIDER_SITE_OTHER): Payer: BLUE CROSS/BLUE SHIELD | Admitting: Neurology

## 2017-01-30 VITALS — BP 149/95 | HR 68 | Resp 20 | Ht 71.0 in | Wt 222.0 lb

## 2017-01-30 DIAGNOSIS — Z9989 Dependence on other enabling machines and devices: Secondary | ICD-10-CM | POA: Diagnosis not present

## 2017-01-30 DIAGNOSIS — G4733 Obstructive sleep apnea (adult) (pediatric): Secondary | ICD-10-CM | POA: Diagnosis not present

## 2017-01-30 MED ORDER — BIMATOPROST 0.03 % EX SOLN: CUTANEOUS | 12 refills | Status: DC

## 2017-01-30 MED ORDER — ARMODAFINIL 250 MG PO TABS: ORAL_TABLET | ORAL | 0 refills | Status: DC

## 2017-01-30 NOTE — Progress Notes (Signed)
[facility]  Provider:  Melvyn Novas, MD  Referring Provider: Jarome Matin, MD Primary Care Physician:  Roberto Fillers, MD   [facility]   HPI:  Roberto Welch is a 56 y.o. male , who is seen here as a referral from Dr. Eloise Welch for a sleep consultation after sleep study.   Roberto Welch, was present in the and the company of his wife. Roberto Welch underwent 2 sleep studies first a diagnostic polysomnography on-6-skin which revealed an AHI of 18.6 and second and respiratory disturbance index of 20.6 equal a months off REM sleep and non-REM sleep dependent apnea were seen but REM sleep accounted for less than 20% of the night.  REM AHI was 54.3 non-REM sleep AHI was 11.5 severe supine AHI was 24.5 and nonsupine sleep position AHI 3.6.  Lowest oxygen saturation was 75% for a total duration of 42.5 minutes, the patient was asked to return for a CPAP titration,  having missed the cutoff line for which his insurance allowed split protocol.  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 Roberto Welch is now followed by Roberto Welch ,  medical equipment company . He reports that he had to pick up his machine at the office and that he had not had any further instructions on how to use it. His compliance reporting for the last 70 days : 76% compliance / average time of use 6 hours and 34 minutes/  average use at home for all Days 4 hours and 58 minutes/  set at 9 cm water - residual AHI is 3.1. Roberto Welch culture that regularly at 9:30 and falls asleep promptly he arises in the morning at 5 AM, he relies on an alarm. He usually has normal sleep interruption and therefore gets an average of over 7 hours of nocturnal sleep. He does not have any nocturia. He is sometimes on back up call for his company , he may get an emergency call - that this is not routinely or regularly the case.The patient reports  that he has not been comfortable with the machine it seems to be less the pressure thats bothersome than the nasal interface , [employer] -  Demonstrating how he uses his  nasal interface I can see that he is developing pressure marks on his upper lip and that the mask seems to be too small. He has reportedly less headaches since using CPAP.   01-07-15 This is a follow-up visit for Roberto Welch who has no no acute problems but still complains after compliant CPAP use of fatigue and high sleepiness and daytime. I was able to interrogate his machine and the tracings show me a 7 hour and 33 minute average daily use of CPAP he has a 87% compliance in a 30 day download he did use the machine only 1 day less than 4 hours. He also was not able to use the machine Christmas eve and Christmas Day. At 9 cm water with EPR of 1 cm water and his wrist the residual apnea index is 1.3 his AHI is 1.7 he still has high air leaks in spite of the low apnea count is using a nasal pillow which has been more comfortable than the mask and interface he has tried first. He reports that he has trouble getting entangled with a 2 or the ear holes. There is no problems with condensation water. His wife  reports that she can hear the air leaks and that it is a loud rushing sound as if water leaks. She usually not with him and he will readjust the mask. She has not reported any breakthrough snoring or breakthrough apnea since he's been on the machine.  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  When I would like to do first is to make the use of CPAP still easier for him I would like him to try a dream where mask with a holes enters from the crown and he has no for had contact with the head gear. This may help the getting entangled heart. In addition since his fatigue severity scale is still so high at 53 I would show consider prescribing Nuvigil in spite of his  condition of hypertension. I think that the fatigue score limits his quality of life and his ability to participate in social activities. [employer] is current DME to refit mask / headgear in supine position and adjust EPR to 2 cm.  Nuvigil 250 mg , devided  Bid.   01-31-16, This is a yearly compliance follow-up for Roberto Welch. He has used a CPAP with 100% compliance for number of days and 93% compliance for number of days was over 4 hours of use. Average user time is 6 hours 54 minutes the machine is set at 9 cm water pressure was 170 m EPR, the residual AHI is 2.2. This is an excellent result and he has been very compliant. He reports however waking up with headaches that he does not feel at the time he went to bed. His Epworth sleepiness score is 10 but is also influenced by the use of modafinil. Fatigue severity score was 34 points on modafinil. His wife has noticed that he acts differently when he does not take Provigil. Roberto Welch describes her husband as irritable and excessively daytime sleepy when not on the medication. 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   01-31-2016, I'm seeing Roberto Welch today here today for a yearly revisit, he is not excessively daytime sleepy he is not fatigued, his Epworth sleepiness score was endorsed at 3 points, his fatigue severity at 17 points and his compliance download shows 100% compliance. He still using AutoSet between 5 and 12 cm water was 1 cm EPR, also known as expiratory pressure relief. He is using the machine 6 hours and 53 minutes on average he does have moderate air leaks, the 95th percentile pressure is 10.5 cm water- well within his AutoSet pressure range.    Review of Systems: Out of a complete 14 system review, the patient complains of only the following symptoms, and all other reviewed systems are negative. Epworth 3 again.  FSS  17 - , "I am still fatigued in my daytime "feels still  no refreshed on CPAP " uses Modafinil, NUVIGIL - renewed prescription.  morning headache  Involving the forehead and temple , area above the cheekbones.     Social History   Social History  . Marital status: Married    Spouse name: Roberto Welch  . Number of children: 0  . Years of education: 14   Occupational History  . Not on file.   Social History Main Topics  . Smoking status: Never Smoker  . Smokeless tobacco: Never Used  . Alcohol use  No  . Drug use: No  . Sexual activity: Not on file   Other Topics Concern  . 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   Patient is working full-time.   Patient has a college education.   Patient is right-handed.   Patient drinks three cans of soda daily.    Family History  Problem Relation Age of Onset  . Lung cancer Father   . Colon polyps Neg Hx   . Colon cancer Neg Hx   . Pancreatic cancer Neg Hx   . Rectal cancer Neg Hx   . Stomach cancer Neg Hx     Past Medical History:  Diagnosis Date  . Allergy   . Arthritis   . Hypertension   . OSA on CPAP     Past Surgical History:  Procedure Laterality Date  . ANKLE FRACTURE SURGERY  15 YRS AGO   RIGHT  . NASAL SEPTUM SURGERY  12 YRS AGO  . ROTATOR CUFF REPAIR  01-22-11   LEFT  . ROTATOR CUFF REPAIR  8 YRS AGO    RIGHT    Current Outpatient Prescriptions  Medication Sig Dispense Refill  . Armodafinil 250 MG tablet Take 1/2 tablet by mouth every morning and every day at lunchtime. (Patient taking differently: Take 250 mg by mouth daily. ) 90 tablet 0  . Aspirin-Caffeine (BC FAST PAIN RELIEF ARTHRITIS) 1000-65 MG PACK Take 2 Packages by mouth every 6 (six) hours as needed. Pain    . loratadine (CLARITIN) 10 MG tablet Take 10 mg by mouth daily.      . meloxicam (MOBIC) 15 MG tablet Take 15 mg by mouth daily.     . methocarbamol (ROBAXIN) 500 MG tablet Take 1 tablet (500 mg total) by mouth 2 (two) times daily. 20 tablet 0   . naproxen (NAPROSYN) 500 MG tablet Take 1 tablet (500 mg total) by mouth 2 (two) times daily with a meal. 30 tablet 0  . pravastatin (PRAVACHOL) 40 MG tablet Take 40 mg by mouth daily.     Marland Kitchen telmisartan (MICARDIS) 40 MG tablet Take 40 mg by mouth daily.     . verapamil (CALAN-SR) 240 MG CR tablet Take 240 mg by mouth Daily.      No current facility-administered medications for this visit.     Allergies as of 01/30/2017 - Review Complete 01/30/2017  Allergen Reaction Noted  . Codeine Nausea And Vomiting 08/17/2011  . Morphine and related Nausea And Vomiting 08/17/2011    Vitals: BP (!) 149/95   Pulse 68   Resp 20   Ht 5\' 11"  (1.803 m)   Wt 222 lb (100.7 kg)   BMI 30.96 kg/m  Last Weight:  Wt Readings from Last 1 Encounters:  01/30/17 222 lb (100.7 kg)   Last Height:   Ht Readings from Last 1 Encounters:  01/30/17 5\' 11"  (1.803 m)    Physical exam:  General: The patient is awake, alert and appears not in acute distress. The patient is well groomed.  Head: Normocephalic, atraumatic. Neck is supple.  Mallampati 3 , neck circumference:16.75 , no TMJ elongated and low uvula.  Able to breath through the nose.  Cardiovascular:  Regular rate and rhythm without  murmurs or carotid bruit, and without distended neck veins. Respiratory: Lungs are clear to auscultation. Skin:  Without evidence of edema, or rash -  Trunk: BMI is elevated and patient  has normal  posture.  Neurologic exam : The patient is awake and alert, oriented to place and time.   Memory subjective described as intact. There is a normal attention span & concentration ability.  Speech is fluent without  dysarthria, dysphonia or aphasia. Mood and affect are frustrated . Cranial nerves: Pupils are equal and briskly reactive to light. Extraocular movements  in vertical and horizontal planes intact and without nystagmus. Visual fields by finger perimetry are intact.Hearing to finger rub intact. Facial motor strength  is symmetric and tongue and uvula move midline. Tongue protrusion is equal into both cheeks.  Motor exam: Normal tone and  bulk and symmetric normal strength in all extremities.   Assessment:  After physical and neurologic examination, review of laboratory studies, imaging, neurophysiology testing and pre-existing records, assessment is  Residual fatigue is still high and BP remains high on CPAP with  93%  compliance.   I will order a refit for a dream waer mask from [employer], I will ask for an auto titration.    1) OSA with hypoxemia. Needs auto-titration setting 5-12 cm water.  Treated on CPAP 9 cm water, both conditions improved.  Considering the patient's high degree of fatigue I have given him a refill of Nuvigil 250 mg .  He can expect 8-10 hours of wakefulness and less fatigue from this medication he will not discontinue his CPAP as he still needs to treat his sleep apnea.      Cc Dr Roberto Welch.  Rogina Schiano, MD

## 2017-01-30 NOTE — Patient Instructions (Signed)

## 2017-04-12 ENCOUNTER — Other Ambulatory Visit: Payer: Self-pay | Admitting: Orthopedic Surgery

## 2017-04-12 DIAGNOSIS — M67911 Unspecified disorder of synovium and tendon, right shoulder: Secondary | ICD-10-CM

## 2017-05-03 ENCOUNTER — Ambulatory Visit
Admission: RE | Admit: 2017-05-03 | Discharge: 2017-05-03 | Disposition: A | Discharge status: Home / Self Care | Payer: BLUE CROSS/BLUE SHIELD | Source: Ambulatory Visit | Attending: Orthopedic Surgery | Admitting: Orthopedic Surgery

## 2017-05-03 DIAGNOSIS — M67911 Unspecified disorder of synovium and tendon, right shoulder: Secondary | ICD-10-CM

## 2017-05-03 MED ORDER — IOPAMIDOL (ISOVUE-M 200) INJECTION 41%
20.0000 mL | Freq: Once | INTRAMUSCULAR | Status: AC
  Administered 2017-05-03: 20 mL via INTRA_ARTICULAR

## 2017-05-13 ENCOUNTER — Other Ambulatory Visit: Payer: Self-pay

## 2017-05-13 DIAGNOSIS — Z9989 Dependence on other enabling machines and devices: Principal | ICD-10-CM

## 2017-05-13 DIAGNOSIS — G4733 Obstructive sleep apnea (adult) (pediatric): Secondary | ICD-10-CM

## 2017-05-13 NOTE — Telephone Encounter (Signed)
Received a fax from [facility] with pt's pa for armodafinil attached. Completed pa, Dr. Vickey Hugerohmeier signed, and I faxed to [facility]. Should have a determination in 3-5 business days.

## 2017-05-14 ENCOUNTER — Telehealth: Payer: Self-pay | Admitting: Neurology

## 2017-05-14 MED ORDER — ARMODAFINIL 250 MG PO TABS: ORAL_TABLET | ORAL | 0 refills | Status: DC

## 2017-05-14 NOTE — Addendum Note (Signed)
Addended by: Geronimo RunningINKINS, Ashwini Jago A on: 05/14/2017 01:50 PM   Modules accepted: Orders

## 2017-05-14 NOTE — Telephone Encounter (Signed)
I have already sent this request to Dr. Vickey Hugerohmeier and am waiting for her response.

## 2017-05-14 NOTE — Telephone Encounter (Signed)
Pt's wife called said the pt is almost out of Armodafinil 250 MG tablet . She was advised it is pending. Please send to [facility].

## 2017-05-14 NOTE — Telephone Encounter (Signed)
[facility] approved pt's armodafinil 05/13/17 to 05/13/18. Pt need a refill on armodafinil. Will send to Dr. Vickey Hugerohmeier for review.

## 2017-05-15 NOTE — Telephone Encounter (Signed)
RX for armodafinil approved and signed by Dr. Vickey Hugerohmeier. RX faxed to [facility]. Received a receipt of confirmation.

## 2017-08-01 ENCOUNTER — Other Ambulatory Visit: Payer: Self-pay | Admitting: Neurology

## 2017-08-01 DIAGNOSIS — G4733 Obstructive sleep apnea (adult) (pediatric): Secondary | ICD-10-CM

## 2017-08-01 DIAGNOSIS — Z9989 Dependence on other enabling machines and devices: Principal | ICD-10-CM

## 2017-08-01 MED ORDER — ARMODAFINIL 250 MG PO TABS: ORAL_TABLET | ORAL | 0 refills | Status: DC

## 2017-11-12 ENCOUNTER — Telehealth: Payer: Self-pay | Admitting: Neurology

## 2017-11-12 NOTE — Telephone Encounter (Signed)
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 She is requesting a new CPAP compliance report for the last 30 days.  Can it be done remotely, does she need to bring in the chip, please call to advise.

## 2017-11-12 NOTE — Telephone Encounter (Signed)
Called patients wife and I have sent the 30 day download over to the patients wife for her to turn in to the patients employer MD

## 2017-11-15 ENCOUNTER — Other Ambulatory Visit: Payer: Self-pay

## 2017-11-15 DIAGNOSIS — Z9989 Dependence on other enabling machines and devices: Principal | ICD-10-CM

## 2017-11-15 DIAGNOSIS — G4733 Obstructive sleep apnea (adult) (pediatric): Secondary | ICD-10-CM

## 2017-11-15 MED ORDER — ARMODAFINIL 250 MG PO TABS: ORAL_TABLET | ORAL | 0 refills | Status: DC

## 2018-02-05 ENCOUNTER — Ambulatory Visit: Payer: BLUE CROSS/BLUE SHIELD | Admitting: Neurology

## 2018-02-05 ENCOUNTER — Encounter: Payer: Self-pay | Admitting: Neurology

## 2018-02-05 ENCOUNTER — Encounter (INDEPENDENT_AMBULATORY_CARE_PROVIDER_SITE_OTHER): Payer: Self-pay

## 2018-02-05 VITALS — BP 145/91 | HR 77 | Ht 71.0 in | Wt 227.0 lb

## 2018-02-05 DIAGNOSIS — G4710 Hypersomnia, unspecified: Secondary | ICD-10-CM

## 2018-02-05 DIAGNOSIS — R51 Headache: Secondary | ICD-10-CM | POA: Diagnosis not present

## 2018-02-05 DIAGNOSIS — Z9989 Dependence on other enabling machines and devices: Secondary | ICD-10-CM

## 2018-02-05 DIAGNOSIS — G4733 Obstructive sleep apnea (adult) (pediatric): Secondary | ICD-10-CM | POA: Diagnosis not present

## 2018-02-05 DIAGNOSIS — R519 Headache, unspecified: Secondary | ICD-10-CM

## 2018-02-05 MED ORDER — ARMODAFINIL 250 MG PO TABS: ORAL_TABLET | ORAL | 3 refills | Status: DC
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
## 2018-02-05 NOTE — Patient Instructions (Signed)
Armodafinil tablets What is this medicine? ARMODAFINIL (ar moe DAF i nil) is used to treat excessive sleepiness caused by certain sleep disorders. This includes narcolepsy, sleep apnea, and shift work sleep disorder. This medicine may be used for other purposes; ask your health care provider or pharmacist if you have questions. COMMON BRAND NAME(S): Nuvigil What should I tell my health care provider before I take this medicine? They need to know if you have any of these conditions: -bipolar disorder -depression -drug or alcohol abuse or addiction -heart disease -high blood pressure -kidney disease -liver disease -schizophrenia -suicidal thoughts, plans, or attempt; a previous suicide attempt by you or a family member -an unusual or allergic reaction to armodafinil, modafinil, medicines, foods, dyes, or preservatives -pregnant or trying to get pregnant -breast-feeding How should I use this medicine? Take this medicine by mouth with a glass of water. Follow the directions on the prescription label. Take your doses at regular intervals. Do not take your medicine more often than directed. Do not stop taking this medicine suddenly except upon the advice of your doctor. Stopping this medicine too quickly may cause serious side effects or your condition may worsen. A special MedGuide will be given to you by the pharmacist with each prescription and refill. Be sure to read this information carefully each time. Talk to your pediatrician regarding the use of this medicine in children. While this drug may be prescribed for children as young as 17 years of age for selected conditions, precautions do apply. Overdosage: If you think you have taken too much of this medicine contact a poison control center or emergency room at once. NOTE: This medicine is only for you. Do not share this medicine with others. What if I miss a dose? If you miss a dose, take it as soon as you can. If it is almost time for  your next dose, take only that dose. Do not take double or extra doses. What may interact with this medicine? Do not take this medicine with any of the following medications: -amphetamine or dextroamphetamine -dexmethylphenidate or methylphenidate -MAOIs like Carbex, Eldepryl, Marplan, Nardil, and Parnate -pemoline -procarbazine This medicine may also interact with the following medications: -antifungal medicines like itraconazole or ketoconazole -barbiturates, like phenobarbital -birth control pills or other hormone-containing birth control devices or implants -carbamazepine -cyclosporine -diazepam -medicines for depression, anxiety, or psychotic disturbances -phenytoin -propranolol -triazolam -warfarin This list may not describe all possible interactions. Give your health care provider a list of all the medicines, herbs, non-prescription drugs, or dietary supplements you use. Also tell them if you smoke, drink alcohol, or use illegal drugs. Some items may interact with your medicine. What should I watch for while using this medicine? Visit your doctor or health care professional for regular checks on your progress. The full effect of this medicine may not be seen right away. This medicine may affect your concentration, function, or may hide signs that you are tired. You may get dizzy. This medicine will not eliminate your abnormal tendency to fall asleep and is not a replacement for sleep. Do not change your previous behavior regarding potentially dangerous activities, such as driving, using machinery, or doing anything that needs mental alertness until you know how this drug affects you. Alcohol can make you more dizzy and may interfere with your response to this medicine or your alertness. Avoid alcoholic drinks. Birth control pills may not work properly while you are taking this medicine. While using birth control pills, you will need an   additional barrier method or an alternative  non-hormonal method of birth control during treatment with armodafinil and for 1 month after stopping armodafinil. Talk to your doctor about which extra method of birth control is right for you. It is unknown if the effects of this medicine will be increased by the use of caffeine. Caffeine is found in many foods, beverages, and medications. Ask your doctor if you should limit or change your intake of caffeine-containing products while on this medicine. Do not stop previously prescribed treatments for your condition, such as a CPAP machine, except on the advise of your physician or health care professional. What side effects may I notice from receiving this medicine? Side effects that you should report to your doctor or health care professional as soon as possible: -allergic reactions like skin rash, itching or hives, swelling of the face, lips, or tongue -anxiety -breathing or swallowing problems -chest pain -depressed mood -elevated mood, decreased need for sleep, racing thoughts, impulsive behavior -fast, irregular heartbeat -fever with rash, swollen lymph nodes, or swelling of the face -hallucination, loss of contact with reality -increased blood pressure -mouth sores, blisters, or peeling skin -sore throat, fever, or chills -suicidal thoughts or other mood changes -tremors -vomiting Side effects that usually do not require medical attention (report to your doctor or health care professional if they continue or are bothersome): -headache -nausea, diarrhea, or stomach upset -nervousness -trouble sleeping This list may not describe all possible side effects. Call your doctor for medical advice about side effects. You may report side effects to FDA at 1-800-FDA-1088. Where should I keep my medicine? Keep out of the reach of children. Store at room temperature between 20 and 25 degrees C (68 and 77 degrees F). Throw away any unused medicine after the expiration date. NOTE: This sheet is  a summary. It may not cover all possible information. If you have questions about this medicine, talk to your doctor, pharmacist, or health care provider.  2018 Elsevier/Gold Standard (2016-05-25 18:16:30)  

## 2018-05-28 ENCOUNTER — Other Ambulatory Visit: Payer: Self-pay | Admitting: Neurology

## 2018-05-28 DIAGNOSIS — R519 Headache, unspecified: Secondary | ICD-10-CM

## 2018-05-28 DIAGNOSIS — Z9989 Dependence on other enabling machines and devices: Principal | ICD-10-CM

## 2018-05-28 DIAGNOSIS — R51 Headache: Secondary | ICD-10-CM

## 2018-05-28 DIAGNOSIS — G4733 Obstructive sleep apnea (adult) (pediatric): Secondary | ICD-10-CM

## 2018-05-28 DIAGNOSIS — G4710 Hypersomnia, unspecified: Secondary | ICD-10-CM

## 2018-05-28 MED ORDER — ARMODAFINIL 250 MG PO TABS: ORAL_TABLET | ORAL | 2 refills | Status: DC

## 2018-05-30 ENCOUNTER — Telehealth: Payer: Self-pay | Admitting: Neurology

## 2018-05-30 NOTE — Telephone Encounter (Signed)
Laura/[facility] [phone]  REF# [id number] called to advise Armodafinil 250 MG tablet needs PA. Please call [phone]

## 2018-05-30 NOTE — Telephone Encounter (Signed)
PA completed on paper per their guideline as they are not associated with Cover my meds. Pa submitted via fax to [employer] to fax # [phone]. Will have to wait to hear from them on their determination.

## 2018-06-02 NOTE — Telephone Encounter (Signed)
PA approved coverage for the Armodafinil 250 mg tablet from 05/30/2018-05/31/2019.

## 2018-07-08 ENCOUNTER — Telehealth: Payer: Self-pay | Admitting: Neurology

## 2018-07-08 NOTE — Telephone Encounter (Signed)
Called the patients wife back and informed her that the PA on file was approved until may 2020. I have informed her I will send my approval letter to [facility] and make them aware. Pt's wife verbalized understanding.

## 2018-07-08 NOTE — Telephone Encounter (Addendum)
Pt's wife called said [facility] is advising her  Armodafinil 250 MG tablet needs PA. Pt is out of the medication. (Pls see tele note 5/31).

## 2018-08-11 ENCOUNTER — Telehealth: Payer: Self-pay | Admitting: Neurology

## 2018-08-11 DIAGNOSIS — G4733 Obstructive sleep apnea (adult) (pediatric): Secondary | ICD-10-CM

## 2018-08-11 DIAGNOSIS — Z9989 Dependence on other enabling machines and devices: Principal | ICD-10-CM

## 2018-08-11 DIAGNOSIS — R51 Headache: Secondary | ICD-10-CM

## 2018-08-11 DIAGNOSIS — R519 Headache, unspecified: Secondary | ICD-10-CM

## 2018-08-11 DIAGNOSIS — G4710 Hypersomnia, unspecified: Secondary | ICD-10-CM

## 2018-08-13 ENCOUNTER — Other Ambulatory Visit: Payer: Self-pay | Admitting: Urology

## 2018-08-19 NOTE — Patient Instructions (Addendum)
Roberto Welch  08/19/2018   Your procedure is scheduled on: Friday 08/22/2018  [facility]   Report to admitting at  0915  AM              Please bring CPAP mask and tubing with you to the hospital.   Call this number if you have problems the morning of surgery [phone]    How to Manage Your Diabetes Before and After Surgery  Why is it important to control my blood sugar before and after surgery? . Improving blood sugar levels before and after surgery helps healing and can limit problems. . A way of improving blood sugar control is eating a healthy diet by: o  Eating less sugar and carbohydrates o  Increasing activity/exercise o  Talking with your doctor about reaching your blood sugar goals . High blood sugars (greater than 180 mg/dL) can raise your risk of infections and slow your recovery, so you will need to focus on controlling your diabetes during the weeks before surgery. . Make sure that the doctor who takes care of your diabetes knows about your planned surgery including the date and location.  How do I manage my blood sugar before surgery? . Check your blood sugar at least 4 times a day, starting 2 days before surgery, to make sure that the level is not too high or low. o Check your blood sugar the morning of your surgery when you wake up and every 2 hours until you get to the Short Stay unit. . If your blood sugar is less than 70 mg/dL, you will need to treat for low blood sugar: o Do not take insulin. o Treat a low blood sugar (less than 70 mg/dL) with  cup of clear juice (cranberry or apple), 4 glucose tablets, OR glucose gel. o Recheck blood sugar in 15 minutes after treatment (to make sure it is greater than 70 mg/dL). If your blood sugar is not greater than 70 mg/dL on recheck, call [phone] for further instructions. . Report your blood sugar to the short stay nurse when you get to Short Stay.  . If you  are admitted to the hospital after surgery: o Your blood sugar will be checked by the staff and you will probably be given insulin after surgery (instead of oral diabetes medicines) to make sure you have good blood sugar levels. o The goal for blood sugar control after surgery is 80-180 mg/dL.   WHAT DO I DO ABOUT MY DIABETES MEDICATION?       The day before surgery, Take Metformin as usual.         The day before surgery, Take Piglitazone (Actos) as usual.        . Do not take oral diabetes medicines (pills) the morning of surgery. .    Remember: Do not eat food or drink liquids :After Midnight.     Take these medicines the morning of surgery with A SIP OF WATER: Verapamil (Calan-SR), Pravastatin (Pravachol)  DO NOT TAKE ANY DIABETIC MEDICATIONS DAY OF YOUR SURGERY                               You may not have any metal on your body including hair pins and  piercings  Do not wear jewelry, make-up, lotions, powders or perfumes, deodorant                         Men may shave face and neck.   Do not bring valuables to the hospital. [facility] IS NOT             RESPONSIBLE   FOR VALUABLES.  Contacts, dentures or bridgework may not be worn into surgery.  Leave suitcase in the car. After surgery it may be brought to your room.     Patients discharged the day of surgery will not be allowed to drive home.  Name and phone number of your driver:spouse- [name]               Please read over the following fact sheets you were given: _____________________________________________________________________             [facility] - Preparing for Surgery Before surgery, you can play an important role.  Because skin is not sterile, your skin needs to be as free of germs as possible.  You can reduce the number of germs on your skin by washing with CHG (chlorahexidine gluconate) soap before surgery.  CHG is an antiseptic cleaner which kills germs and bonds with the skin to  continue killing germs even after washing. Please DO NOT use if you have an allergy to CHG or antibacterial soaps.  If your skin becomes reddened/irritated stop using the CHG and inform your nurse when you arrive at Short Stay. Do not shave (including legs and underarms) for at least 48 hours prior to the first CHG shower.  You may shave your face/neck. Please follow these instructions carefully:  1.  Shower with CHG Soap the night before surgery and the  morning of Surgery.  2.  If you choose to wash your hair, wash your hair first as usual with your  normal  shampoo.  3.  After you shampoo, rinse your hair and body thoroughly to remove the  shampoo.                           4.  Use CHG as you would any other liquid soap.  You can apply chg directly  to the skin and wash                       Gently with a scrungie or clean washcloth.  5.  Apply the CHG Soap to your body ONLY FROM THE NECK DOWN.   Do not use on face/ open                           Wound or open sores. Avoid contact with eyes, ears mouth and genitals (private parts).                       Wash face,  Genitals (private parts) with your normal soap.             6.  Wash thoroughly, paying special attention to the area where your surgery  will be performed.  7.  Thoroughly rinse your body with warm water from the neck down.  8.  DO NOT shower/wash with your normal soap after using and rinsing off  the CHG Soap.  9.  Pat yourself dry with a clean towel.            10.  Wear clean pajamas.            11.  Place clean sheets on your bed the night of your first shower and do not  sleep with pets. Day of Surgery : Do not apply any lotions/deodorants the morning of surgery.  Please wear clean clothes to the hospital/surgery center.  FAILURE TO FOLLOW THESE INSTRUCTIONS MAY RESULT IN THE CANCELLATION OF YOUR SURGERY PATIENT SIGNATURE_________________________________  NURSE  SIGNATURE__________________________________  ________________________________________________________________________

## 2018-08-20 ENCOUNTER — Encounter (HOSPITAL_COMMUNITY): Payer: Self-pay

## 2018-08-20 ENCOUNTER — Other Ambulatory Visit: Payer: Self-pay

## 2018-08-20 ENCOUNTER — Encounter (HOSPITAL_COMMUNITY)
Admission: RE | Admit: 2018-08-20 | Discharge: 2018-08-20 | Disposition: A | Discharge status: Home / Self Care | Payer: BLUE CROSS/BLUE SHIELD | Source: Ambulatory Visit | Attending: Urology | Admitting: Urology

## 2018-08-20 DIAGNOSIS — I10 Essential (primary) hypertension: Secondary | ICD-10-CM | POA: Insufficient documentation

## 2018-08-20 DIAGNOSIS — N201 Calculus of ureter: Secondary | ICD-10-CM | POA: Insufficient documentation

## 2018-08-20 DIAGNOSIS — Z01818 Encounter for other preprocedural examination: Secondary | ICD-10-CM | POA: Diagnosis present

## 2018-08-20 LAB — CBC
HCT: 40.5 % (ref 39.0–52.0)
Hemoglobin: 13.6 g/dL (ref 13.0–17.0)
MCH: 32.4 pg (ref 26.0–34.0)
MCHC: 33.6 g/dL (ref 30.0–36.0)
MCV: 96.4 fL (ref 78.0–100.0)
PLATELETS: 225 10*3/uL (ref 150–400)
RBC: 4.2 MIL/uL — ABNORMAL LOW (ref 4.22–5.81)
RDW: 12.6 % (ref 11.5–15.5)
WBC: 6.2 10*3/uL (ref 4.0–10.5)

## 2018-08-20 LAB — HEMOGLOBIN A1C
Hgb A1c MFr Bld: 10.1 % — ABNORMAL HIGH (ref 4.8–5.6)
Mean Plasma Glucose: 243.17 mg/dL

## 2018-08-20 LAB — BASIC METABOLIC PANEL
Anion gap: 9 (ref 5–15)
BUN: 22 mg/dL — AB (ref 6–20)
CHLORIDE: 105 mmol/L (ref 98–111)
CO2: 27 mmol/L (ref 22–32)
CREATININE: 1.25 mg/dL — AB (ref 0.61–1.24)
Calcium: 9.5 mg/dL (ref 8.9–10.3)
GFR calc Af Amer: >60 mL/min (ref >60)
GFR calc non Af Amer: >60 mL/min (ref >60)
Glucose, Bld: 272 mg/dL — ABNORMAL HIGH (ref 70–99)
Potassium: 4.5 mmol/L (ref 3.5–5.1)
Sodium: 141 mmol/L (ref 135–145)

## 2018-08-20 LAB — GLUCOSE, CAPILLARY: GLUCOSE-CAPILLARY: 235 mg/dL — AB (ref 70–99)

## 2018-08-21 MED ORDER — GENTAMICIN SULFATE 40 MG/ML IJ SOLN
5.0000 mg/kg | INTRAVENOUS | Status: AC
  Administered 2018-08-22: 430 mg via INTRAVENOUS
  Filled 2018-08-21 (×2): qty 10.75

## 2018-08-22 ENCOUNTER — Ambulatory Visit (HOSPITAL_COMMUNITY): Payer: BLUE CROSS/BLUE SHIELD

## 2018-08-22 ENCOUNTER — Ambulatory Visit (HOSPITAL_COMMUNITY)
Admission: RE | Admit: 2018-08-22 | Discharge: 2018-08-22 | Disposition: A | Discharge status: Home / Self Care | Payer: BLUE CROSS/BLUE SHIELD | Source: Ambulatory Visit | Attending: Urology | Admitting: Urology

## 2018-08-22 ENCOUNTER — Encounter (HOSPITAL_COMMUNITY)
Admission: RE | Disposition: A | Discharge status: Home / Self Care | Payer: Self-pay | Source: Ambulatory Visit | Attending: Urology

## 2018-08-22 ENCOUNTER — Ambulatory Visit (HOSPITAL_COMMUNITY): Payer: BLUE CROSS/BLUE SHIELD | Admitting: Anesthesiology

## 2018-08-22 ENCOUNTER — Other Ambulatory Visit: Payer: Self-pay

## 2018-08-22 ENCOUNTER — Encounter (HOSPITAL_COMMUNITY): Payer: Self-pay | Admitting: *Deleted

## 2018-08-22 DIAGNOSIS — N201 Calculus of ureter: Secondary | ICD-10-CM | POA: Diagnosis present

## 2018-08-22 DIAGNOSIS — Z885 Allergy status to narcotic agent status: Secondary | ICD-10-CM | POA: Insufficient documentation

## 2018-08-22 DIAGNOSIS — G4733 Obstructive sleep apnea (adult) (pediatric): Secondary | ICD-10-CM | POA: Diagnosis not present

## 2018-08-22 DIAGNOSIS — E119 Type 2 diabetes mellitus without complications: Secondary | ICD-10-CM | POA: Insufficient documentation

## 2018-08-22 DIAGNOSIS — I10 Essential (primary) hypertension: Secondary | ICD-10-CM | POA: Diagnosis not present

## 2018-08-22 DIAGNOSIS — E785 Hyperlipidemia, unspecified: Secondary | ICD-10-CM | POA: Diagnosis not present

## 2018-08-22 DIAGNOSIS — M1990 Unspecified osteoarthritis, unspecified site: Secondary | ICD-10-CM | POA: Insufficient documentation

## 2018-08-22 HISTORY — PX: HOLMIUM LASER APPLICATION: SHX5852

## 2018-08-22 HISTORY — PX: CYSTOSCOPY WITH RETROGRADE PYELOGRAM, URETEROSCOPY AND STENT PLACEMENT: SHX5789

## 2018-08-22 HISTORY — DX: Type 2 diabetes mellitus without complications: E11.9

## 2018-08-22 LAB — GLUCOSE, CAPILLARY
GLUCOSE-CAPILLARY: 137 mg/dL — AB (ref 70–99)
Glucose-Capillary: 152 mg/dL — ABNORMAL HIGH (ref 70–99)

## 2018-08-22 SURGERY — CYSTOURETEROSCOPY, WITH RETROGRADE PYELOGRAM AND STENT INSERTION
Anesthesia: General | Site: Urethra | Laterality: Right

## 2018-08-22 MED ORDER — FENTANYL CITRATE (PF) 100 MCG/2ML IJ SOLN
INTRAMUSCULAR | Status: DC | PRN
  Administered 2018-08-22 (×2): 25 ug via INTRAVENOUS

## 2018-08-22 MED ORDER — SODIUM CHLORIDE 0.9 % IR SOLN
Status: DC | PRN
  Administered 2018-08-22: 6000 mL via INTRAVESICAL

## 2018-08-22 MED ORDER — OXYCODONE-ACETAMINOPHEN 5-325 MG PO TABS: 1.0000 | ORAL_TABLET | Freq: Four times a day (QID) | ORAL | 0 refills | Status: DC | PRN

## 2018-08-22 MED ORDER — METOCLOPRAMIDE HCL 5 MG/ML IJ SOLN: 10.0000 mg | Freq: Once | INTRAMUSCULAR | Status: DC | PRN

## 2018-08-22 MED ORDER — FENTANYL CITRATE (PF) 100 MCG/2ML IJ SOLN
25.0000 ug | INTRAMUSCULAR | Status: DC | PRN
  Administered 2018-08-22 (×2): 50 ug via INTRAVENOUS

## 2018-08-22 MED ORDER — SENNOSIDES-DOCUSATE SODIUM 8.6-50 MG PO TABS: 1.0000 | ORAL_TABLET | Freq: Two times a day (BID) | ORAL | 0 refills | Status: DC

## 2018-08-22 MED ORDER — KETOROLAC TROMETHAMINE 10 MG PO TABS: 10.0000 mg | ORAL_TABLET | Freq: Four times a day (QID) | ORAL | 0 refills | Status: DC | PRN

## 2018-08-22 MED ORDER — LIDOCAINE 2% (20 MG/ML) 5 ML SYRINGE
INTRAMUSCULAR | Status: DC | PRN
  Administered 2018-08-22: 50 mg via INTRAVENOUS

## 2018-08-22 MED ORDER — FENTANYL CITRATE (PF) 100 MCG/2ML IJ SOLN
INTRAMUSCULAR | Status: AC
  Filled 2018-08-22: qty 2

## 2018-08-22 MED ORDER — MIDAZOLAM HCL 2 MG/2ML IJ SOLN
INTRAMUSCULAR | Status: AC
  Filled 2018-08-22: qty 2

## 2018-08-22 MED ORDER — IOHEXOL 300 MG/ML  SOLN
INTRAMUSCULAR | Status: DC | PRN
  Administered 2018-08-22: 15 mL via URETHRAL

## 2018-08-22 MED ORDER — MIDAZOLAM HCL 5 MG/5ML IJ SOLN
INTRAMUSCULAR | Status: DC | PRN
  Administered 2018-08-22: 2 mg via INTRAVENOUS

## 2018-08-22 MED ORDER — MEPERIDINE HCL 50 MG/ML IJ SOLN: 6.2500 mg | INTRAMUSCULAR | Status: DC | PRN

## 2018-08-22 MED ORDER — EPHEDRINE SULFATE-NACL 50-0.9 MG/10ML-% IV SOSY
PREFILLED_SYRINGE | INTRAVENOUS | Status: DC | PRN
  Administered 2018-08-22 (×2): 5 mg via INTRAVENOUS

## 2018-08-22 MED ORDER — DEXAMETHASONE SODIUM PHOSPHATE 10 MG/ML IJ SOLN
INTRAMUSCULAR | Status: DC | PRN
  Administered 2018-08-22: 10 mg via INTRAVENOUS

## 2018-08-22 MED ORDER — PROPOFOL 10 MG/ML IV BOLUS
INTRAVENOUS | Status: DC | PRN
  Administered 2018-08-22: 150 mg via INTRAVENOUS

## 2018-08-22 MED ORDER — CEPHALEXIN 500 MG PO CAPS: 500.0000 mg | ORAL_CAPSULE | Freq: Two times a day (BID) | ORAL | 0 refills | Status: AC

## 2018-08-22 MED ORDER — ONDANSETRON HCL 4 MG/2ML IJ SOLN
INTRAMUSCULAR | Status: DC | PRN
  Administered 2018-08-22: 4 mg via INTRAVENOUS

## 2018-08-22 MED ORDER — PROPOFOL 10 MG/ML IV BOLUS
INTRAVENOUS | Status: AC
  Filled 2018-08-22: qty 20

## 2018-08-22 MED ORDER — LACTATED RINGERS IV SOLN: INTRAVENOUS | Status: DC

## 2018-08-22 MED ORDER — LACTATED RINGERS IV SOLN
INTRAVENOUS | Status: DC
  Administered 2018-08-22 (×2): via INTRAVENOUS

## 2018-08-22 SURGICAL SUPPLY — 26 items
BAG URO CATCHER STRL LF (MISCELLANEOUS) ×4 IMPLANT
BASKET LASER NITINOL 1.9FR (BASKET) IMPLANT
BSKT STON RTRVL 120 1.9FR (BASKET)
CATH INTERMIT  6FR 70CM (CATHETERS) ×4 IMPLANT
CLOTH BEACON ORANGE TIMEOUT ST (SAFETY) ×4 IMPLANT
COVER SURGICAL LIGHT HANDLE (MISCELLANEOUS) ×4 IMPLANT
EXTRACTOR STONE 1.7FRX115CM (UROLOGICAL SUPPLIES) ×2 IMPLANT
FIBER LASER FLEXIVA 1000 (UROLOGICAL SUPPLIES) IMPLANT
FIBER LASER FLEXIVA 365 (UROLOGICAL SUPPLIES) IMPLANT
FIBER LASER FLEXIVA 550 (UROLOGICAL SUPPLIES) IMPLANT
FIBER LASER TRAC TIP (UROLOGICAL SUPPLIES) ×2 IMPLANT
GLOVE BIOGEL M STRL SZ7.5 (GLOVE) ×4 IMPLANT
GOWN STRL REUS W/TWL LRG LVL3 (GOWN DISPOSABLE) ×8 IMPLANT
GUIDEWIRE ANG ZIPWIRE 038X150 (WIRE) ×4 IMPLANT
GUIDEWIRE STR DUAL SENSOR (WIRE) ×4 IMPLANT
IV NS 1000ML (IV SOLUTION) ×4
IV NS 1000ML BAXH (IV SOLUTION) ×2 IMPLANT
MANIFOLD NEPTUNE II (INSTRUMENTS) ×4 IMPLANT
PACK CYSTO (CUSTOM PROCEDURE TRAY) ×4 IMPLANT
SHEATH URETERAL 12FRX28CM (UROLOGICAL SUPPLIES) IMPLANT
SHEATH URETERAL 12FRX35CM (MISCELLANEOUS) IMPLANT
STENT POLARIS 5FRX26 (STENTS) ×2 IMPLANT
SYR CONTROL 10ML LL (SYRINGE) ×4 IMPLANT
TUBE FEEDING 8FR 16IN STR KANG (MISCELLANEOUS) ×4 IMPLANT
TUBING CONNECTING 10 (TUBING) ×3 IMPLANT
TUBING CONNECTING 10' (TUBING) ×1

## 2018-08-22 NOTE — Discharge Instructions (Signed)
1 - You may have urinary urgency (bladder spasms) and bloody urine on / off with stent in place. This is normal.  2 - Remove tethered stent on Monday morning by pulling on string, then blue-white plastic tubing, and discarding. Office is open Monday if any problems arise.   3 - Call MD or go to ER for fever >102, severe pain / nausea / vomiting not relieved by medications, or acute change in medical status  

## 2018-08-22 NOTE — Brief Op Note (Signed)
08/22/2018  11:47 AM  PATIENT:  Roberto PentaKenneth E Welch  57 y.o. male  PRE-OPERATIVE DIAGNOSIS:  right ureteral stone  POST-OPERATIVE DIAGNOSIS:  right ureteral stone  PROCEDURE:  Procedure(s): CYSTOSCOPY WITH RIGHT  RETROGRADE URETEROSCOPY LASER  LITHO AND STENT PLACEMENT (Right) HOLMIUM LASER APPLICATION (Right)  SURGEON:  Surgeon(s) and Role:    Sebastian Ache* Zakhai Meisinger, MD - Primary  PHYSICIAN ASSISTANT:   ASSISTANTS: none   ANESTHESIA:   general  EBL:  Minimal   BLOOD ADMINISTERED:none  DRAINS: none   LOCAL MEDICATIONS USED:  NONE  SPECIMEN:  Source of Specimen:  Rt ureteral stone fragments  DISPOSITION OF SPECIMEN:  [facility] for compositional analysis  COUNTS:  YES  TOURNIQUET:  * No tourniquets in log *  DICTATION: .Other Dictation: Dictation Number [id number]  PLAN OF CARE: Discharge to home after PACU  PATIENT DISPOSITION:  PACU - hemodynamically stable.   Delay start of Pharmacological VTE agent (>24hrs) due to surgical blood loss or risk of bleeding: not applicable

## 2018-08-22 NOTE — Anesthesia Procedure Notes (Signed)
Procedure Name: LMA Insertion Date/Time: 08/22/2018 11:11 AM Performed by: Paris LoreBlanton, Jayle Solarz M, CRNA Pre-anesthesia Checklist: Patient identified, Emergency Drugs available, Suction available, Patient being monitored and Timeout performed Patient Re-evaluated:Patient Re-evaluated prior to induction Oxygen Delivery Method: Circle system utilized Preoxygenation: Pre-oxygenation with 100% oxygen Induction Type: IV induction Ventilation: Mask ventilation without difficulty LMA: LMA inserted LMA Size: 4.0 Number of attempts: 1 Placement Confirmation: positive ETCO2 and breath sounds checked- equal and bilateral Tube secured with: Tape

## 2018-08-22 NOTE — Op Note (Signed)
NAME: Roberto Welch, Roberto Welch MEDICAL RECORD [id number] ACCOUNT [id number] DATE OF BIRTH:01-Jan-1961 FACILITY: [facility] LOCATION: [facility] PHYSICIAN:Alara Daniel Berneice Heinrich, MD  OPERATIVE REPORT  DATE OF PROCEDURE:  08/22/2018  PREOPERATIVE DIAGNOSIS:  Right ureteral stone.  PROCEDURE: 1.  Cystoscopy, right retrograde pyelogram interpretation. 2.  Right ureteroscopy with laser lithotripsy. 3.  Insertion of right ureteral stent 5 x 26 Polaris with tether.  ESTIMATED BLOOD LOSS:  Nil.  COMPLICATIONS:  None.  SPECIMEN:  Right ureteral stone fragments for analysis.  FINDINGS: 1.  Impacted right distal ureteral stone just above the level of the intramural ureter. 2.  Complete resolution of all stone fragments larger than one third millimeter within the kidney and ureter. 3.  Successful placement of right ureteral stent proximal and renal pelvis, distally in urinary bladder with tether.    INDICATIONS:  The patient is a 57 year old gentleman who was found on workup for colicky flank pain to have a right distal ureteral stone approximately 8 mm.  He had some question of a very punctate right intrarenal stone as well.  Options were discussed  for management including medical therapy versus shockwave lithotripsy versus ureteroscopy, and he wished to proceed with the latter given the distal stone location and questionable multifocality.  Informed consent was obtained and placed in medical  record.  DESCRIPTION OF PROCEDURE:  The patient was identified.  Procedure being right ureteroscopic stone manipulation was confirmed.  Procedure timeout was performed.  Antibiotics administered, general LMA anesthesia induced.  The patient was placed into the  low lithotomy position and sterile field was created by prepping and draping the patient's penis, perineum and proximal thighs using iodine.  Cystourethroscopy was performed using a 22-French rigid cystoscope with offset lens.  Inspection of the anterior  and  posterior urethra were unremarkable.  Inspection in the urinary bladder revealed no diverticula, calcifications, papillary lesions.  The right ureteral orifice was cannulated with a 6-French open-ended catheter and right retrograde pyelogram was  obtained.  Right retrograde pyelogram demonstrated a single right ureter, single system right kidney.  There was a filling defect in the distal ureter consistent with known stone.  There was mild hydroureteronephrosis above this point.  A 0.038 ZIPwire was advanced  to the upper pole and set aside as a safety wire.  An 8-French feeding tube was placed in the urinary bladder for pressure release, and semirigid ureteroscopy was performed of the distal ureter alongside a separate sensor working wire.  As expected just  above the level of the intramural ureter, there was a single dominant calcification noted appeared to be much too large for simple basketing.  As such, holmium laser energy applied at a setting of 0.3 joules and 30 Hz.  The stone was fragmented into  approximately 4 smaller pieces.  These were then sequentially grasped and escape basket removed and set aside for composition analysis.  Following this, the remaining distal orifice of the right ureter was inspected with a semirigid scope.  No additional  calcifications were noted.  There was some question of intrarenal stone.  The semirigid scope was exchanged for a single channel flexible digital ureteroscope over the sensor working wire to the level of the kidney and flexible digital ureteroscopy was  performed of the kidney, including all calices x3.  No evidence of intraluminal stones were seen whatsoever.  The flexible scope was then carefully removed under continuous vision.  There was some mild to moderate mucosal edema at the site of prior stone  impaction as anticipated.  As  such, it was felt that brief interval stenting would be warranted with tethered stent and a new 5 x 26 Polaris-type stent  was placed using fluoroscopic guidance.  Good proximal and distal plane were noted.  Tether was left  in place and fashioned to the dorsum of the penis.  The procedure terminated.  The patient tolerated procedure well.  There were no immediate complications.  The patient was taken to postanesthesia care in stable condition.  TN/NUANCE  D:08/22/2018 T:08/22/2018 JOB:[id number]

## 2018-08-22 NOTE — H&P (Signed)
Roberto Welch is an 57 y.o. male.    Chief Complaint: Pre-op RIGHT Ureteroscopic STone Manipulation  HPI:   1 - Nephrolithiasis - One episode stone passed medically around 2002.   07/2018 - Rt 53m UVJ stone with mild hydro. Stone is 841m 1100HU. UA without infectious parameters. 30m44mt renal stones as well.   PMH sig for OSA/CPAP, DM2, HTN, HLD. His PCP is DanLeanna Battles   Today " Roberto Welch seen to proceed with RIGHT ureteroscopic stone manipulation. NO interval fevers.    Past Medical History:  Diagnosis Date  . Allergy   . Arthritis   . Hypertension   . OSA on CPAP     Past Surgical History:  Procedure Laterality Date  . ANKLE FRACTURE SURGERY  15 YRS AGO   RIGHT  . NASAL SEPTUM SURGERY  12 YRS AGO  . ROTATOR CUFF REPAIR  01-22-11   LEFT  . ROTATOR CUFF REPAIR  8 YRS AGO    RIGHT    Family History  Problem Relation Age of Onset  . Lung cancer Father   . Colon polyps Neg Hx   . Colon cancer Neg Hx   . Pancreatic cancer Neg Hx   . Rectal cancer Neg Hx   . Stomach cancer Neg Hx    Social History:  reports that he has never smoked. He has never used smokeless tobacco. He reports that he does not drink alcohol or use drugs.  Allergies:  Allergies  Allergen Reactions  . Codeine Nausea And Vomiting    MIGRAINE HEADACHES  . Morphine And Related Nausea And Vomiting    MIGRAINE HEADACHE    No medications prior to admission.    Results for orders placed or performed during the hospital encounter of 08/20/18 (from the past 48 hour(s))  Glucose, capillary     Status: Abnormal   Collection Time: 08/20/18  1:53 PM  Result Value Ref Range   Glucose-Capillary 235 (H) 70 - 99 mg/dL  Hemoglobin A1c     Status: Abnormal   Collection Time: 08/20/18  2:45 PM  Result Value Ref Range   Hgb A1c MFr Bld 10.1 (H) 4.8 - 5.6 %    Comment: (NOTE) Pre diabetes:          5.7%-6.4% Diabetes:              >6.4% Glycemic control for   <7.0% adults with diabetes    Mean  Plasma Glucose 243.17 mg/dL    Comment: Performed at [id number] [id number] metabolic panel     Status: Abnormal   Collection Time: 08/20/18  2:45 PM  Result Value Ref Range   Sodium 141 135 - 145 mmol/L   Potassium 4.5 3.5 - 5.1 mmol/L   Chloride 105 98 - 111 mmol/L   CO2 27 22 - 32 mmol/L   Glucose, Bld 272 (H) 70 - 99 mg/dL   BUN 22 (H) 6 - 20 mg/dL   Creatinine, Ser 1.25 (H) 0.61 - 1.24 mg/dL   Calcium 9.5 8.9 - 10.3 mg/dL   GFR calc non Af Amer >60 >60 mL/min   GFR calc Af Amer >60 >60 mL/min    Comment: (NOTE) The eGFR has been calculated using the CKD EPI equation. This calculation has not been validated in all clinical situations. eGFR's persistently <60 mL/min signify possible Chronic Kidney Disease.    Anion gap 9 5 - 15  Comment: Performed at [facility], [place] [address] [place], [place] [address]  CBC     Status: Abnormal   Collection Time: 08/20/18  2:45 PM  Result Value Ref Range   WBC 6.2 4.0 - 10.5 K/uL   RBC 4.20 (L) 4.22 - 5.81 MIL/uL   Hemoglobin 13.6 13.0 - 17.0 g/dL   HCT 40.5 39.0 - 52.0 %   MCV 96.4 78.0 - 100.0 fL   MCH 32.4 26.0 - 34.0 pg   MCHC 33.6 30.0 - 36.0 g/dL   RDW 12.6 11.5 - 15.5 %   Platelets 225 150 - 400 K/uL    Comment: Performed at [facility], [place] [address] [place], [place] [address]   No results found.  Review of Systems  Constitutional: Negative.  Negative for chills and fever.  HENT: Negative.   Respiratory: Negative.   Cardiovascular: Negative.   Gastrointestinal: Negative.   Genitourinary: Positive for flank pain.  Skin: Negative.   Neurological: Negative.   Endo/Heme/Allergies: Negative.   Psychiatric/Behavioral: Negative.     There were no vitals taken for this visit. Physical Exam  Constitutional: He appears well-developed.  Eyes: Pupils are equal, round, and reactive to light.  Neck: Normal range of motion.   Cardiovascular: Normal rate.  Genitourinary:  Genitourinary Comments: Mild Rt CVAT  Musculoskeletal: Normal range of motion.  Neurological: He is alert.  Skin: Skin is warm.  Psychiatric: He has a normal mood and affect.     Assessment/Plan  Proceed as planned with RIGHT ureteroscopy with goal of stone free. Risks, benefits, alternatives, expected peri-op course discussed previously and reiterated today.   Alexis Frock, MD 08/22/2018, 7:29 AM

## 2018-08-22 NOTE — Anesthesia Preprocedure Evaluation (Signed)
Anesthesia Evaluation  Patient identified by MRN, date of birth, ID band Patient awake    Reviewed: Allergy & Precautions, NPO status , Patient's Chart, lab work & pertinent test results  Airway Mallampati: II  TM Distance: >3 FB Neck ROM: Full    Dental no notable dental hx.    Pulmonary sleep apnea and Continuous Positive Airway Pressure Ventilation ,    Pulmonary exam normal breath sounds clear to auscultation       Cardiovascular hypertension, Pt. on medications Normal cardiovascular exam Rhythm:Regular Rate:Normal     Neuro/Psych negative neurological ROS  negative psych ROS   GI/Hepatic negative GI ROS, Neg liver ROS,   Endo/Other  diabetes, Type 2  Renal/GU negative Renal ROS  negative genitourinary   Musculoskeletal negative musculoskeletal ROS (+)   Abdominal   Peds negative pediatric ROS (+)  Hematology negative hematology ROS (+)   Anesthesia Other Findings   Reproductive/Obstetrics negative OB ROS                             Anesthesia Physical Anesthesia Plan  ASA: II  Anesthesia Plan: General   Post-op Pain Management:    Induction: Intravenous  PONV Risk Score and Plan: 2 and Ondansetron and Treatment may vary due to age or medical condition  Airway Management Planned: LMA  Additional Equipment:   Intra-op Plan:   Post-operative Plan: Extubation in OR  Informed Consent: I have reviewed the patients History and Physical, chart, labs and discussed the procedure including the risks, benefits and alternatives for the proposed anesthesia with the patient or authorized representative who has indicated his/her understanding and acceptance.   Dental advisory given  Plan Discussed with: CRNA  Anesthesia Plan Comments:         Anesthesia Quick Evaluation

## 2018-08-22 NOTE — Anesthesia Postprocedure Evaluation (Signed)
Anesthesia Post Note  Patient: Roberto Welch  Procedure(s) Performed: CYSTOSCOPY WITH RIGHT  RETROGRADE URETEROSCOPY LASER  LITHO AND STENT PLACEMENT (Right Urethra) HOLMIUM LASER APPLICATION (Right Ureter)     Patient location during evaluation: PACU Anesthesia Type: General Level of consciousness: awake and alert Pain management: pain level controlled Vital Signs Assessment: post-procedure vital signs reviewed and stable Respiratory status: spontaneous breathing, nonlabored ventilation, respiratory function stable and patient connected to nasal cannula oxygen Cardiovascular status: blood pressure returned to baseline and stable Postop Assessment: no apparent nausea or vomiting Anesthetic complications: no    Last Vitals:  Vitals:   08/22/18 1230 08/22/18 1305  BP: 129/84 (!) 133/91  Pulse: 75 73  Resp: 15 18  Temp: 36.8 C 36.6 C  SpO2: 98% 100%    Last Pain:  Vitals:   08/22/18 1305  TempSrc:   PainSc: 0-No pain                 Phillips Groutarignan, Lugene Beougher

## 2018-08-22 NOTE — Transfer of Care (Signed)
Immediate Anesthesia Transfer of Care Note  Patient: Roberto Welch  Procedure(s) Performed: Procedure(s): CYSTOSCOPY WITH RIGHT  RETROGRADE URETEROSCOPY LASER  LITHO AND STENT PLACEMENT (Right) HOLMIUM LASER APPLICATION (Right)  Patient Location: PACU  Anesthesia Type:General  Level of Consciousness:  sedated, patient cooperative and responds to stimulation  Airway & Oxygen Therapy:Patient Spontanous Breathing and Patient connected to face mask oxgen  Post-op Assessment:  Report given to PACU RN and Post -op Vital signs reviewed and stable  Post vital signs:  Reviewed and stable  Last Vitals:  Vitals:   08/22/18 0921  BP: (!) 144/82  Pulse: 70  Temp: 36.9 C  SpO2: 96%    Complications: No apparent anesthesia complications

## 2018-08-23 ENCOUNTER — Encounter (HOSPITAL_COMMUNITY): Payer: Self-pay | Admitting: Urology

## 2018-09-03 ENCOUNTER — Other Ambulatory Visit: Payer: Self-pay | Admitting: Neurology

## 2018-09-03 DIAGNOSIS — Z9989 Dependence on other enabling machines and devices: Principal | ICD-10-CM

## 2018-09-03 DIAGNOSIS — R519 Headache, unspecified: Secondary | ICD-10-CM

## 2018-09-03 DIAGNOSIS — R51 Headache: Secondary | ICD-10-CM

## 2018-09-03 DIAGNOSIS — G4733 Obstructive sleep apnea (adult) (pediatric): Secondary | ICD-10-CM

## 2018-09-03 DIAGNOSIS — G4710 Hypersomnia, unspecified: Secondary | ICD-10-CM

## 2018-09-03 MED ORDER — ARMODAFINIL 250 MG PO TABS: 250.0000 mg | ORAL_TABLET | Freq: Every day | ORAL | 1 refills | Status: DC

## 2018-09-03 NOTE — Telephone Encounter (Signed)
Order sent for the patient to [facility]

## 2018-09-03 NOTE — Telephone Encounter (Signed)
Patient's wife [name] (on [place]) requesting refill for Armodafinil 250 MG tablet sent to [facility]. She says pharmacy has no refills.

## 2018-09-30 ENCOUNTER — Telehealth: Payer: Self-pay

## 2018-09-30 NOTE — Telephone Encounter (Signed)
Armodafinill 250mg  rx form for prescription form fax to [facility] at [phone]. Form fax twice and confirmed.

## 2018-11-19 ENCOUNTER — Telehealth: Payer: Self-pay | Admitting: Neurology

## 2018-11-19 NOTE — Telephone Encounter (Signed)
Pt wife(on [place]) has called to inform that re: pt needing DOT physical she is asking if there could be a download from the last 2 months faxed to her @ [phone]

## 2018-11-19 NOTE — Telephone Encounter (Signed)
Sent a copy of the pt last 60 days of cpap download data to the fax number. Received a receipt of confirmation.

## 2018-12-03 NOTE — Telephone Encounter (Signed)
Pts wife called stating the report didn't have the pts name on it, stating the report was not readable. Requesting a call to discuss getting another one emailed to her if possible or she can pick up the letter during her lunch break. Please advise stating they are needing this before 12/7

## 2018-12-03 NOTE — Telephone Encounter (Signed)
Called the wife back and advised that I can place the report at the front for her to pick up. Report reprinted to show last 3 mths and placed at the front desk.

## 2019-02-05 ENCOUNTER — Ambulatory Visit: Payer: BLUE CROSS/BLUE SHIELD | Admitting: Neurology
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
## 2019-02-09 ENCOUNTER — Encounter: Payer: Self-pay | Admitting: Nurse Practitioner

## 2019-02-09 ENCOUNTER — Ambulatory Visit: Payer: No Typology Code available for payment source | Admitting: Nurse Practitioner

## 2019-02-09 VITALS — BP 115/69 | HR 79 | Ht 71.0 in | Wt 215.0 lb

## 2019-02-09 DIAGNOSIS — R519 Headache, unspecified: Secondary | ICD-10-CM

## 2019-02-09 DIAGNOSIS — Z9989 Dependence on other enabling machines and devices: Secondary | ICD-10-CM

## 2019-02-09 DIAGNOSIS — R51 Headache: Secondary | ICD-10-CM

## 2019-02-09 DIAGNOSIS — G4710 Hypersomnia, unspecified: Secondary | ICD-10-CM

## 2019-02-09 DIAGNOSIS — G4733 Obstructive sleep apnea (adult) (pediatric): Secondary | ICD-10-CM | POA: Diagnosis not present

## 2019-02-09 MED ORDER — ARMODAFINIL 250 MG PO TABS: 250.0000 mg | ORAL_TABLET | Freq: Every day | ORAL | 1 refills | Status: DC

## 2019-02-09 NOTE — Patient Instructions (Signed)
CPAP compliance 100% greater than 4 hours Continue same settings Will reorder Armodafinil Follow-up yearly and as needed

## 2019-08-18 ENCOUNTER — Telehealth: Payer: Self-pay | Admitting: Neurology

## 2019-08-18 ENCOUNTER — Other Ambulatory Visit: Payer: Self-pay | Admitting: Neurology

## 2019-08-18 DIAGNOSIS — G4733 Obstructive sleep apnea (adult) (pediatric): Secondary | ICD-10-CM

## 2019-08-18 DIAGNOSIS — G4710 Hypersomnia, unspecified: Secondary | ICD-10-CM

## 2019-08-18 DIAGNOSIS — R519 Headache, unspecified: Secondary | ICD-10-CM

## 2019-08-18 MED ORDER — ARMODAFINIL 250 MG PO TABS: 250.0000 mg | ORAL_TABLET | Freq: Every day | ORAL | 1 refills | Status: DC

## 2019-08-18 NOTE — Telephone Encounter (Signed)
Pt is requesting a refill of Armodafinil 250 MG tablet , to be sent to [facility], [place]

## 2019-08-18 NOTE — Telephone Encounter (Signed)
I have routed this request to Dr Dohmeier for review. The pt is due for the medication and [place] registry was verified.  

## 2019-11-19 ENCOUNTER — Telehealth: Payer: Self-pay | Admitting: Neurology

## 2019-11-19 NOTE — Telephone Encounter (Signed)
Pts wife called in and stated they need a copy of he sleep machine from the past 36mo. She said she can come in office to pick up on Monday

## 2019-11-19 NOTE — Telephone Encounter (Signed)
Print out is downloaded and printed. Placed in envelope at the front desk.

## 2020-02-16 ENCOUNTER — Encounter: Payer: Self-pay | Admitting: Neurology

## 2020-02-16 ENCOUNTER — Other Ambulatory Visit: Payer: Self-pay

## 2020-02-16 ENCOUNTER — Ambulatory Visit: Payer: 59 | Admitting: Neurology

## 2020-02-16 VITALS — BP 122/74 | HR 55 | Temp 96.6°F | Ht 71.0 in | Wt 194.0 lb

## 2020-02-16 DIAGNOSIS — G4733 Obstructive sleep apnea (adult) (pediatric): Secondary | ICD-10-CM

## 2020-02-16 DIAGNOSIS — E118 Type 2 diabetes mellitus with unspecified complications: Secondary | ICD-10-CM | POA: Diagnosis not present

## 2020-02-16 DIAGNOSIS — R351 Nocturia: Secondary | ICD-10-CM | POA: Diagnosis not present

## 2020-02-16 DIAGNOSIS — R5382 Chronic fatigue, unspecified: Secondary | ICD-10-CM | POA: Diagnosis not present

## 2020-02-16 DIAGNOSIS — Z9989 Dependence on other enabling machines and devices: Secondary | ICD-10-CM
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
## 2020-08-03 ENCOUNTER — Other Ambulatory Visit: Payer: Self-pay | Admitting: Internal Medicine

## 2020-08-03 DIAGNOSIS — K4090 Unilateral inguinal hernia, without obstruction or gangrene, not specified as recurrent: Secondary | ICD-10-CM

## 2020-08-11 ENCOUNTER — Ambulatory Visit
Admission: RE | Admit: 2020-08-11 | Discharge: 2020-08-11 | Disposition: A | Discharge status: Home / Self Care | Payer: BLUE CROSS/BLUE SHIELD | Source: Ambulatory Visit | Attending: Internal Medicine | Admitting: Internal Medicine

## 2020-08-11 DIAGNOSIS — K4090 Unilateral inguinal hernia, without obstruction or gangrene, not specified as recurrent: Secondary | ICD-10-CM

## 2020-08-11 MED ORDER — IOPAMIDOL (ISOVUE-300) INJECTION 61%
100.0000 mL | Freq: Once | INTRAVENOUS | Status: AC | PRN
  Administered 2020-08-11: 100 mL via INTRAVENOUS

## 2020-09-09 ENCOUNTER — Other Ambulatory Visit: Payer: Self-pay | Admitting: Surgery

## 2020-09-27 ENCOUNTER — Other Ambulatory Visit: Payer: Self-pay | Admitting: Internal Medicine

## 2020-09-27 DIAGNOSIS — K4090 Unilateral inguinal hernia, without obstruction or gangrene, not specified as recurrent: Secondary | ICD-10-CM

## 2021-02-16 ENCOUNTER — Telehealth: Payer: Self-pay

## 2021-02-16 ENCOUNTER — Ambulatory Visit: Payer: 59 | Admitting: Neurology

## 2021-02-16 NOTE — Telephone Encounter (Signed)
I contacted patient and left voicemail.  Advised patient Dr. Vickey Huger was not feeling well and we would need to reschedule his 330 appointment for today.  Patient was advised to call our office back so we can schedule this appointment for him.  If patient is willing he can see a nurse practitioner or this appointment could be a virtual appointment if he would like.  I called pt's wife and was able to speak with her, pt rescheduled for 03/29/2021 at 330.

## 2021-03-29 ENCOUNTER — Encounter: Payer: Self-pay | Admitting: Neurology

## 2021-03-29 ENCOUNTER — Ambulatory Visit: Payer: 59 | Admitting: Neurology

## 2021-03-29 VITALS — BP 161/99 | HR 79 | Ht 71.0 in | Wt 210.0 lb

## 2021-03-29 DIAGNOSIS — R351 Nocturia: Secondary | ICD-10-CM

## 2021-03-29 DIAGNOSIS — G4733 Obstructive sleep apnea (adult) (pediatric): Secondary | ICD-10-CM

## 2021-03-29 DIAGNOSIS — Z9114 Patient's other noncompliance with medication regimen: Secondary | ICD-10-CM | POA: Diagnosis not present

## 2021-03-29 DIAGNOSIS — Z9989 Dependence on other enabling machines and devices: Secondary | ICD-10-CM

## 2021-03-29 DIAGNOSIS — R5382 Chronic fatigue, unspecified: Secondary | ICD-10-CM | POA: Diagnosis not present

## 2021-03-29 DIAGNOSIS — G4739 Other sleep apnea: Secondary | ICD-10-CM
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
## 2021-03-30 ENCOUNTER — Telehealth: Payer: Self-pay

## 2021-03-30 NOTE — Telephone Encounter (Signed)
LVM for pt to call me back to schedule sleep study  

## 2021-04-24 ENCOUNTER — Ambulatory Visit (INDEPENDENT_AMBULATORY_CARE_PROVIDER_SITE_OTHER): Payer: 59 | Admitting: Neurology

## 2021-04-24 DIAGNOSIS — G4733 Obstructive sleep apnea (adult) (pediatric): Secondary | ICD-10-CM

## 2021-04-24 DIAGNOSIS — Z9114 Patient's other noncompliance with medication regimen: Secondary | ICD-10-CM

## 2021-04-24 DIAGNOSIS — Z9989 Dependence on other enabling machines and devices: Secondary | ICD-10-CM

## 2021-04-24 DIAGNOSIS — G4739 Other sleep apnea: Secondary | ICD-10-CM

## 2021-04-24 DIAGNOSIS — R351 Nocturia: Secondary | ICD-10-CM

## 2021-04-25 NOTE — Progress Notes (Signed)
[facility]  HOME SLEEP TEST (Watch PAT)  STUDY DATE: 04/28/21  DOB: [birth year]  MRN: [id number]  ORDERING CLINICIAN: Melvyn Novas, MD   REFERRING CLINICIAN: Jarome Matin, MD   CLINICAL INFORMATION/HISTORY: Roberto Welch, 60 years-old, he has been a sleep apnea patient in my practice since 2015.  In 2020 it was documented that he had 100% PAP therapy compliance from January through February of that year.   In the meantime,he feels like it is not helping as much and it is somewhat aggravating him- he does not like to get entangled in the headgear .  In 2021 he had skipped at least 1 week of CPAP use but now his compliance for the last 30 days has been around 70%. 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  His machine is set to 5 cm minimum pressure 12 cm maximum pressure and 1 cm expiratory pressure relief his residual apnea index is very good 2.2/h the residual apneas seem to be central in nature and that makes me concerned that this patient may have too much pressure available.  Of his 2.2 residual apneas 1.5 were indicated to be of central origin.  Pressure at the 95th percentile was 9.3 cmH2O.  he has not taken armodafinil for over a year- he had high blood pressures, and he felt no need for it.  He reports a lot of mask leaking , 95% leak at 22.5 l.  He has not gotten a new headgear or mask for a while.   he is OK to retest by HST and would consider INSPIRE if positive for an apnea form that is likely to respond. 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  Status post kidney stone surgery and hernia repair.   Epworth sleepiness score: 5/24.  BMI: 29.3 kg/m  Neck Circumference: 16.75 "  FINDINGS:   Total Record Time (hours, min): 6 h 25 min  Total Sleep Time (hours, min):  6 h 2 min   Percent REM (%):    30.47  %   Calculated pAHI (per hour): 19.4       REM pAHI: 23.8    NREM pAHI: 17.9 Supine AHI: 26.9                                                                                                                                                 Non- supine 8.9/h   Oxygen Saturation (%) Mean: 94  Minimum oxygen saturation (%):         87   O2 Saturation Range (%): 87-99  O2Saturation (minutes) <=88%: 0.1 min  Pulse Mean (bpm):    65  Pulse Range (45-101)  IMPRESSION: This HST documented moderate OSA (obstructive sleep apnea) increased in frequency during REM sleep and in supine sleep position. There was no prolonged hypoxemia noted.      RECOMMENDATION: avoiding supine sleep is important to decrease apnea frequency, and if this patiet is no longer inclined to CPAP use , he could be a candidate for inspire implant - please note that there is no REM sleep dependent apnea, nor prolonged hypoxemia and that the BMI is under 32.    INTERPRETING PHYSICIAN:  Melvyn Novas, MD  [facility] [address], [address] [place], [place] [address] [phone]   Sleep Summary  Oxygen Saturation Statistics   Start Study Time: End Study Time: Total Recording Time:  9:17:42 PM 3:42:53 AM 6 h, 25 min  Total Sleep Time % REM of Sleep Time:  6 h, 2 min  30.5    Mean: 94 Minimum: 87 Maximum: 99  Mean of Desaturations Nadirs (%):   92  Oxygen Desatur. %: 4-9 10-20 >20 Total  Events Number Total  33 100.0  0 0.0  0 0.0  33 100.0  Oxygen Saturation: <90 <=88 <85 <80 <70  Duration (minutes): Sleep % 0.1 0.1 0.0 0.0 0.0 0.0 0.0 0.0 0.0 0.0     Respiratory Indices      Total Events REM NREM All Night  pRDI: pAHI 3%: ODI 4%: pAHIc 3%: % CSR: pAHI 4%:  109  106  33  4 0.0 34 25.2 23.8 7.7 1.4 18.1 17.9 5.5 0.8 20.0 19.4 6.1 1.1 6.2       Pulse Rate Statistics during Sleep (BPM)      Mean: 65 Minimum: 45 Maximum: 101         Body Position  Statistics  Position Supine Prone Right Left Non-Supine  Sleep (min) 216.2 0.0 64.0 82.5 146.5  Sleep % 59.6 0.0 17.6 22.7 40.4  pRDI 27.2 N/A 7.7 11.5 9.7  pAHI 3% 26.9 N/A 6.8 10.6 8.9  ODI 4% 9.4 N/A 0.0 2.5 1.3     Snoring Statistics Snoring Level (dB) >40 >50 >60 >70 >80 >Threshold (45)  Sleep (min) 7.2 2.7 0.9 0.0 0.0 3.6  Sleep % 2.0 0.7 0.2 0.0 0.0 1.0    Mean: 40 dB

## 2021-05-22 NOTE — Progress Notes (Signed)
Cc Dr Eloise Harman:  IMPRESSION: This HST documented moderate OSA (obstructive sleep apnea) increased in frequency during REM sleep and in supine sleep position. There was no prolonged hypoxemia noted.  RECOMMENDATION: avoiding supine sleep is important to decrease apnea frequency, and if this patiet is no longer inclined to CPAP use , he could be a candidate for inspire implant - please note that there is no REM sleep dependent apnea, nor prolonged hypoxemia and that the BMI is under 32.    INTERPRETING PHYSICIAN:  Melvyn Novas, MD

## 2021-05-22 NOTE — Addendum Note (Signed)
Addended by: Melvyn Novas on: 05/22/2021 08:18 AM   Modules accepted: Orders

## 2021-05-22 NOTE — Procedures (Signed)
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  In 2020 it was documented that he had 100% PAP therapy compliance from January through February of that year.   In the meantime,he feels like it is not helping as much and it is somewhat aggravating him- he does not like to get entangled in the headgear .  In 2021 he had skipped at least 1 week of CPAP use but now his compliance for the last 30 days has been around 70%. 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  His machine is set to 5 cm minimum pressure 12 cm maximum pressure and 1 cm expiratory pressure relief his residual apnea index is very good 2.2/h the residual apneas seem to be central in nature and that makes me concerned that this patient may have too much pressure available.  Of his 2.2 residual apneas 1.5 were indicated to be of central origin.  Pressure at the 95th percentile was 9.3 cmH2O.  he has not taken armodafinil for over a year- he had high blood pressures, and he felt no need for it.  He reports a lot of mask leaking , 95% leak at 22.5 l.  He has not gotten a new headgear or mask for a while.   Requesting retesting ( HST) and would consider INSPIRE if positive for an apnea form that is likely to respond. He will have to document his compliance with any apnea treatment once a year.  Status post kidney stone surgery and hernia repair.   Epworth sleepiness score: 5/24.  BMI: 29.3 kg/m  Neck Circumference: 16.75 "  FINDINGS:   Total Record Time (hours, min): 6 h 25 min  Total Sleep Time (hours, min):  6 h 2 min   Percent REM (%):    30.47  %   Calculated pAHI (per hour): 19.4       REM pAHI: 23.8    NREM pAHI: 17.9 Supine AHI: 26.9                                                                                                                                                 Non- supine 8.9/h   Oxygen Saturation (%) Mean: 94  Minimum oxygen saturation (%):         87   O2 Saturation Range (%): 87-99  O2Saturation (minutes) <=88%: 0.1 min  Pulse Mean (bpm):    65  Pulse Range (45-101)   IMPRESSION: This HST documented  moderate OSA (obstructive sleep apnea) increased in frequency during REM sleep and in supine sleep position. There was no prolonged hypoxemia noted.      RECOMMENDATION: avoiding supine sleep is important to decrease apnea frequency, and if this patiet is no longer inclined to CPAP use , he could be a candidate for inspire implant - please note that there is no REM sleep dependent apnea, nor prolonged hypoxemia and that the BMI is under 32.    INTERPRETING PHYSICIAN:  Melvyn Novas, MD  [name] [address] [place], [place] [address] [phone]   Sleep Summary  Oxygen Saturation Statistics   Start Study Time: End Study Time: Total Recording Time:  9:17:42 PM 3:42:53 AM 6 h, 25 min  Total Sleep Time % REM of Sleep Time:  6 h, 2 min  30.5    Mean: 94 Minimum: 87 Maximum: 99  Mean of Desaturations Nadirs (%):   92  Oxygen Desatur. %: 4-9 10-20 >20 Total  Events Number Total  33 100.0  0 0.0  0 0.0  33 100.0  Oxygen Saturation: <90 <=88 <85 <80 <70  Duration (minutes): Sleep % 0.1 0.1 0.0 0.0 0.0 0.0 0.0 0.0 0.0 0.0     Respiratory Indices      Total Events REM NREM All Night  pRDI: pAHI 3%: ODI 4%: pAHIc 3%: % CSR: pAHI 4%:  109  106  33  4 0.0 34 25.2 23.8 7.7 1.4 18.1 17.9 5.5 0.8 20.0 19.4 6.1 1.1 6.2       Pulse Rate Statistics during Sleep (BPM)      Mean: 65 Minimum: 45 Maximum: 101         Body Position  Statistics  Position Supine Prone Right Left Non-Supine  Sleep (min) 216.2 0.0 64.0 82.5 146.5  Sleep % 59.6 0.0 17.6 22.7 40.4  pRDI 27.2 N/A 7.7 11.5 9.7  pAHI 3% 26.9 N/A 6.8 10.6 8.9  ODI 4% 9.4 N/A 0.0 2.5 1.3     Snoring Statistics Snoring Level (dB) >40 >50 >60 >70 >80 >Threshold (45)  Sleep (min) 7.2 2.7 0.9 0.0 0.0 3.6  Sleep % 2.0 0.7 0.2 0.0 0.0 1.0    Mean: 40 dB

## 2021-05-23 ENCOUNTER — Telehealth: Payer: Self-pay | Admitting: Neurology

## 2021-05-23 NOTE — Telephone Encounter (Signed)
Called the patient and speak with his wife. Reviewed in detail the sleep study results. Was able to discuss the study with her. Advised that since moderate apnea is still present the patient can continue to use his CPAP machine or can discuss possibly the inspire procedure. Informed her briefly on inspire procedure and what that would look like. Informed if he wanted to assess looking into that, we could place a referral to the ENT to see if he would be a candidate. In the meantime the patient can also avoid sleeping on his back as the apnea was better and reduced when on his side. She will discuss all of this with the patient and she will reach back out with what they would like to do. She was appreciative for the information.

## 2021-05-23 NOTE — Telephone Encounter (Signed)
-----   Message from Melvyn Novas, MD sent at 05/22/2021  8:18 AM EDT ----- Cc Dr Eloise Harman:  IMPRESSION: This HST documented moderate OSA (obstructive sleep apnea) increased in frequency during REM sleep and in supine sleep position. There was no prolonged hypoxemia noted.  RECOMMENDATION: avoiding supine sleep is important to decrease apnea frequency, and if this patiet is no longer inclined to CPAP use , he could be a candidate for inspire implant - please note that there is no REM sleep dependent apnea, nor prolonged hypoxemia and that the BMI is under 32.    INTERPRETING PHYSICIAN:  Melvyn Novas, MD

## 2022-03-27 DIAGNOSIS — E119 Type 2 diabetes mellitus without complications: Secondary | ICD-10-CM | POA: Diagnosis not present

## 2022-06-14 DIAGNOSIS — E119 Type 2 diabetes mellitus without complications: Secondary | ICD-10-CM | POA: Diagnosis not present

## 2022-07-25 ENCOUNTER — Ambulatory Visit (INDEPENDENT_AMBULATORY_CARE_PROVIDER_SITE_OTHER): Payer: BC Managed Care – PPO | Admitting: Family Medicine

## 2022-07-25 ENCOUNTER — Encounter: Payer: Self-pay | Admitting: Family Medicine

## 2022-07-25 VITALS — BP 120/78 | HR 97 | Ht 71.0 in | Wt 201.0 lb

## 2022-07-25 DIAGNOSIS — G4733 Obstructive sleep apnea (adult) (pediatric): Secondary | ICD-10-CM

## 2022-07-25 DIAGNOSIS — Z9989 Dependence on other enabling machines and devices: Secondary | ICD-10-CM | POA: Diagnosis not present

## 2022-07-25 NOTE — Patient Instructions (Addendum)
Please continue using your CPAP regularly. While your insurance requires that you use CPAP at least 4 hours each night on 70% of the nights, I recommend, that you not skip any nights and use it throughout the night if you can. Getting used to CPAP and staying with the treatment long term does take time and patience and discipline. Untreated obstructive sleep apnea when it is moderate to severe can have an adverse impact on cardiovascular health and raise her risk for heart disease, arrhythmias, hypertension, congestive heart failure, stroke and diabetes. Untreated obstructive sleep apnea causes sleep disruption, nonrestorative sleep, and sleep deprivation. This can have an impact on your day to day functioning and cause daytime sleepiness and impairment of cognitive function, memory loss, mood disturbance, and problems focussing. Using CPAP regularly can improve these symptoms.  Please keep working on compliance. 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   Follow up in 1 year.

## 2022-07-25 NOTE — Progress Notes (Signed)
PATIENT: Roberto Welch DOB: [birth year]  REASON FOR VISIT: follow up HISTORY FROM: patient  Chief Complaint  Patient presents with   Obstructive Sleep Apnea    Rm 16, alone. Here for CPAP f/u. Pt reports doing well on CPAP.      HISTORY OF PRESENT ILLNESS:  07/25/22 ALL:  Roberto Welch is a 61 y.o. male here today for follow up for OSA on CPAP.  He was last seen by Dr Vickey Huger 02/2021 and reported difficulty tolerating CPAP. Repeat HST showed continued concerns of moderate OSA. Inspire versus CPAP discussed and he has continued CPAP. He admits that he continues to struggle meeting compliance. He does not not any significant benefit in using CPAP, however, he does report that his headaches have resolved. 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 He denies concerns with machine or supplies.     HISTORY: (copied from Dr Dohmeier's previous note)  Roberto Welch, 60 years-old, he has been a sleep apnea patient in my practice since 2015.  In 2020 it was documented that he had 100% compliance from January through February of that year.   In the meantime he states that he feels like it is not helping as much and it is somewhat aggravating him he does not like to get entangled in the headgear and home use.  In 2021 he had skipped at least 1 week of CPAP use but now his compliance for the last 30 days has been around 70%. 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  His machine is set to 5 cm minimum pressure 12 cm maximum pressure and 1 cm expiratory pressure relief his residual apnea index is very good 2.2/h the residual apneas seem to be central in nature and that makes me concerned that this patient may have too much pressure available.  Of his 2.2 residual apneas 1.5 were indicated to be of central origin.  Pressure at the 95th percentile was 9.3 cmH2O.  he has not taken armodafinil  for over a year- he had high blood pressures, and he felt no need for it.   He reports a lot of mask leaking , 95% leak at 22.5 l. He has not gotten a new headgear or mask for a while.   He is OK to retest by HST and would consider INSPIRE if positive for an apnea form that is likely to respond. 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  Status post kidney stone surgery and hernia repair.    REVIEW OF SYSTEMS: Out of a complete 14 system review of symptoms, the patient complains only of the following symptoms, none and all other reviewed systems are negative.  ESS: 5/24  ALLERGIES: Allergies  Allergen Reactions   Codeine Nausea And Vomiting    MIGRAINE HEADACHES   Morphine And Related Nausea And Vomiting    MIGRAINE HEADACHE    HOME MEDICATIONS: Outpatient Medications Prior to Visit  Medication Sig Dispense Refill   JARDIANCE 25 MG TABS tablet Take 25 mg by mouth daily.     levothyroxine (SYNTHROID) 50 MCG tablet Take 50 mcg by mouth daily.     loratadine (CLARITIN) 10 MG tablet Take 1 tablet by mouth daily.     meloxicam (MOBIC) 15 MG tablet Take 15 mg by mouth daily.  metFORMIN (GLUCOPHAGE) 500 MG tablet Take 500 mg by mouth 2 (two) times daily with a meal.     pravastatin (PRAVACHOL) 40 MG tablet Take 40 mg by mouth daily.      telmisartan (MICARDIS) 40 MG tablet Take 40 mg by mouth daily.      TRADJENTA 5 MG TABS tablet Take 5 mg by mouth daily.     verapamil (CALAN-SR) 240 MG CR tablet Take 240 mg by mouth Daily.      cetirizine (ZYRTEC) 10 MG tablet Take 10 mg by mouth daily.     No facility-administered medications prior to visit.    PAST MEDICAL HISTORY: Past Medical History:  Diagnosis Date   Allergy    Arthritis    Diabetes mellitus without complication (HCC)    Hypertension    OSA on CPAP     PAST SURGICAL HISTORY: Past Surgical History:  Procedure Laterality Date   ANKLE FRACTURE SURGERY  15 YRS AGO    RIGHT   CYSTOSCOPY WITH RETROGRADE PYELOGRAM, URETEROSCOPY AND STENT PLACEMENT Right 08/22/2018   Procedure: CYSTOSCOPY WITH RIGHT  RETROGRADE URETEROSCOPY LASER  LITHO AND STENT PLACEMENT;  Surgeon: Sebastian Ache, MD;  Location: [facility];  Service: Urology;  Laterality: Right;   HOLMIUM LASER APPLICATION Right 08/22/2018   Procedure: HOLMIUM LASER APPLICATION;  Surgeon: Sebastian Ache, MD;  Location: [facility];  Service: Urology;  Laterality: Right;   NASAL SEPTUM SURGERY  12 YRS AGO   ROTATOR CUFF REPAIR  01-22-11   LEFT   ROTATOR CUFF REPAIR  8 YRS AGO    RIGHT    FAMILY HISTORY: Family History  Problem Relation Age of Onset   Lung cancer Father    Colon polyps Neg Hx    Colon cancer Neg Hx    Pancreatic cancer Neg Hx    Rectal cancer Neg Hx    Stomach cancer Neg Hx     SOCIAL HISTORY: Social History   Socioeconomic History   Marital status: Married    Spouse name: [name]   Number of children: 0   Years of education: 14   Highest education level: Not on file  Occupational History   Not on file  Tobacco Use   Smoking status: Never   Smokeless tobacco: Never  Vaping Use   Vaping Use: Never used  Substance and Sexual Activity   Alcohol use: No   Drug use: No   Sexual activity: Not on file  Other Topics Concern   Not on file  Social History Narrative   Patient is married ([name]) and lives at home with his wife.   Patient is working full-time.   Patient has a college education.   Patient is right-handed.   Patient drinks three cans of soda daily.   Social Determinants of Health   Financial Resource Strain: Not on file  Food Insecurity: Not on file  Transportation Needs: Not on file  Physical Activity: Not on file  Stress: Not on file  Social Connections: Not on file  Intimate Partner Violence: Not on file     PHYSICAL EXAM  Vitals:   07/25/22 1433  BP: 120/78  Pulse: 97  Weight: 201 lb (91.2 kg)  Height: 5\' 11"  (1.803 m)   Body mass index is 28.03  kg/m.  Generalized: Well developed, in no acute distress  Cardiology: normal rate and rhythm, no murmur noted Respiratory: clear to auscultation bilaterally  Neurological examination  Mentation: Alert oriented to time, place, history taking. Follows all commands speech  and language fluent Cranial nerve II-XII: Pupils were equal round reactive to light. Extraocular movements were full, visual field were full  Motor: The motor testing reveals 5 over 5 strength of all 4 extremities. Good symmetric motor tone is noted throughout.  Gait and station: Gait is normal.    DIAGNOSTIC DATA (LABS, IMAGING, TESTING) - I reviewed patient records, labs, notes, testing and imaging myself where available.      No data to display           Lab Results  Component Value Date   WBC 6.2 08/20/2018   HGB 13.6 08/20/2018   HCT 40.5 08/20/2018   MCV 96.4 08/20/2018   PLT 225 08/20/2018      Component Value Date/Time   NA 141 08/20/2018 1445   K 4.5 08/20/2018 1445   CL 105 08/20/2018 1445   CO2 27 08/20/2018 1445   GLUCOSE 272 (H) 08/20/2018 1445   BUN 22 (H) 08/20/2018 1445   CREATININE 1.25 (H) 08/20/2018 1445   CALCIUM 9.5 08/20/2018 1445   GFRNONAA >60 08/20/2018 1445   GFRAA >60 08/20/2018 1445   No results found for: "CHOL", "HDL", "LDLCALC", "LDLDIRECT", "TRIG", "CHOLHDL" Lab Results  Component Value Date   HGBA1C 10.1 (H) 08/20/2018   No results found for: "VITAMINB12" No results found for: "TSH"   ASSESSMENT AND PLAN 61 y.o. year old male  has a past medical history of Allergy, Arthritis, Diabetes mellitus without complication (HCC), Hypertension, and OSA on CPAP. here with     ICD-10-CM   1. OSA on CPAP  G47.33    Z99.89         Roberto Welch is doing well on CPAP therapy. Compliance report reveals sub optimal usage. He has not noted any benefit outside of resolved headaches. He has to meet DOT requirements for physical in 10/2022.  He was encouraged to continue  using CPAP nightly and for greater than 4 hours each night. We will update supply orders as indicated. Risks of untreated sleep apnea review and education materials provided. Healthy lifestyle habits encouraged. He will follow up in 1 year, sooner if needed. He verbalizes understanding and agreement with this plan.    No orders of the defined types were placed in this encounter.    No orders of the defined types were placed in this encounter.     Shawnie Dapper, FNP-C 07/25/2022, 3:05 PM [facility] [address], [address] [place], [place] [address] [phone]

## 2022-08-23 DIAGNOSIS — L02211 Cutaneous abscess of abdominal wall: Secondary | ICD-10-CM | POA: Diagnosis not present

## 2022-11-08 ENCOUNTER — Telehealth: Payer: Self-pay | Admitting: *Deleted

## 2022-11-08 DIAGNOSIS — E119 Type 2 diabetes mellitus without complications: Secondary | ICD-10-CM | POA: Diagnosis not present

## 2022-11-08 NOTE — Telephone Encounter (Signed)
LVM for pt relaying Amy's message. Advised him to call back if he has any questions.

## 2022-11-13 DIAGNOSIS — E119 Type 2 diabetes mellitus without complications: Secondary | ICD-10-CM | POA: Diagnosis not present

## 2022-11-13 DIAGNOSIS — E039 Hypothyroidism, unspecified: Secondary | ICD-10-CM | POA: Diagnosis not present

## 2022-11-13 DIAGNOSIS — E785 Hyperlipidemia, unspecified: Secondary | ICD-10-CM | POA: Diagnosis not present

## 2022-11-13 DIAGNOSIS — Z125 Encounter for screening for malignant neoplasm of prostate: Secondary | ICD-10-CM | POA: Diagnosis not present

## 2022-11-20 DIAGNOSIS — Z0000 Encounter for general adult medical examination without abnormal findings: Secondary | ICD-10-CM | POA: Diagnosis not present

## 2022-11-20 DIAGNOSIS — E119 Type 2 diabetes mellitus without complications: Secondary | ICD-10-CM | POA: Diagnosis not present

## 2022-11-20 DIAGNOSIS — I10 Essential (primary) hypertension: Secondary | ICD-10-CM | POA: Diagnosis not present

## 2022-11-20 DIAGNOSIS — R82998 Other abnormal findings in urine: Secondary | ICD-10-CM | POA: Diagnosis not present

## 2022-11-20 DIAGNOSIS — E1151 Type 2 diabetes mellitus with diabetic peripheral angiopathy without gangrene: Secondary | ICD-10-CM | POA: Diagnosis not present

## 2022-11-20 DIAGNOSIS — I739 Peripheral vascular disease, unspecified: Secondary | ICD-10-CM | POA: Diagnosis not present

## 2023-02-06 DIAGNOSIS — E119 Type 2 diabetes mellitus without complications: Secondary | ICD-10-CM | POA: Diagnosis not present

## 2023-02-06 DIAGNOSIS — Z794 Long term (current) use of insulin: Secondary | ICD-10-CM | POA: Diagnosis not present

## 2023-02-06 DIAGNOSIS — E785 Hyperlipidemia, unspecified: Secondary | ICD-10-CM | POA: Diagnosis not present

## 2023-02-06 DIAGNOSIS — I10 Essential (primary) hypertension: Secondary | ICD-10-CM | POA: Diagnosis not present

## 2023-02-13 ENCOUNTER — Telehealth: Payer: Self-pay | Admitting: Family Medicine

## 2023-02-13 NOTE — Telephone Encounter (Signed)
Please let him know that his compliance report looks great!! He is using therapy most every night for about 6 hours on average. AHI is well managed at 2/h. Pressure settings look perfect. We will see him in July unless ne needs [place] sooner. TY.

## 2023-02-13 NOTE — Telephone Encounter (Signed)
Called pt. Relayed AL,NP message. Pt verbalized understanding and appreciation.

## 2023-02-22 DIAGNOSIS — M25562 Pain in left knee: Secondary | ICD-10-CM | POA: Diagnosis not present

## 2023-02-22 DIAGNOSIS — M25552 Pain in left hip: Secondary | ICD-10-CM | POA: Diagnosis not present

## 2023-02-25 DIAGNOSIS — E119 Type 2 diabetes mellitus without complications: Secondary | ICD-10-CM | POA: Diagnosis not present

## 2023-02-25 DIAGNOSIS — H40013 Open angle with borderline findings, low risk, bilateral: Secondary | ICD-10-CM | POA: Diagnosis not present

## 2023-05-21 DIAGNOSIS — E785 Hyperlipidemia, unspecified: Secondary | ICD-10-CM | POA: Diagnosis not present

## 2023-05-21 DIAGNOSIS — Z794 Long term (current) use of insulin: Secondary | ICD-10-CM | POA: Diagnosis not present

## 2023-05-21 DIAGNOSIS — I10 Essential (primary) hypertension: Secondary | ICD-10-CM | POA: Diagnosis not present

## 2023-05-21 DIAGNOSIS — E119 Type 2 diabetes mellitus without complications: Secondary | ICD-10-CM | POA: Diagnosis not present

## 2023-07-29 ENCOUNTER — Ambulatory Visit: Payer: BC Managed Care – PPO | Admitting: Family Medicine

## 2023-08-21 DIAGNOSIS — E119 Type 2 diabetes mellitus without complications: Secondary | ICD-10-CM | POA: Diagnosis not present

## 2023-10-14 ENCOUNTER — Encounter: Payer: Self-pay | Admitting: Family Medicine

## 2023-10-14 ENCOUNTER — Telehealth: Payer: Self-pay | Admitting: Family Medicine

## 2023-10-14 NOTE — Telephone Encounter (Signed)
Lvm and sent letter in mail informing pt of need to reschedule 01/07/24 appt - NP out

## 2023-10-29 DIAGNOSIS — Z7189 Other specified counseling: Secondary | ICD-10-CM | POA: Diagnosis not present

## 2023-10-29 DIAGNOSIS — E1151 Type 2 diabetes mellitus with diabetic peripheral angiopathy without gangrene: Secondary | ICD-10-CM | POA: Diagnosis not present

## 2023-12-12 DIAGNOSIS — E039 Hypothyroidism, unspecified: Secondary | ICD-10-CM | POA: Diagnosis not present

## 2023-12-12 DIAGNOSIS — Z125 Encounter for screening for malignant neoplasm of prostate: Secondary | ICD-10-CM | POA: Diagnosis not present

## 2023-12-12 DIAGNOSIS — E785 Hyperlipidemia, unspecified: Secondary | ICD-10-CM | POA: Diagnosis not present

## 2023-12-12 DIAGNOSIS — E119 Type 2 diabetes mellitus without complications: Secondary | ICD-10-CM | POA: Diagnosis not present

## 2023-12-19 DIAGNOSIS — Z0000 Encounter for general adult medical examination without abnormal findings: Secondary | ICD-10-CM | POA: Diagnosis not present

## 2023-12-19 DIAGNOSIS — E039 Hypothyroidism, unspecified: Secondary | ICD-10-CM | POA: Diagnosis not present

## 2023-12-19 DIAGNOSIS — Z1331 Encounter for screening for depression: Secondary | ICD-10-CM | POA: Diagnosis not present

## 2023-12-19 DIAGNOSIS — I10 Essential (primary) hypertension: Secondary | ICD-10-CM | POA: Diagnosis not present

## 2023-12-19 DIAGNOSIS — E119 Type 2 diabetes mellitus without complications: Secondary | ICD-10-CM | POA: Diagnosis not present

## 2023-12-26 ENCOUNTER — Ambulatory Visit: Payer: BC Managed Care – PPO | Admitting: Family Medicine

## 2024-01-07 ENCOUNTER — Ambulatory Visit: Payer: BC Managed Care – PPO | Admitting: Family Medicine

## 2024-01-24 DIAGNOSIS — I10 Essential (primary) hypertension: Secondary | ICD-10-CM | POA: Diagnosis not present

## 2024-01-24 DIAGNOSIS — E1151 Type 2 diabetes mellitus with diabetic peripheral angiopathy without gangrene: Secondary | ICD-10-CM | POA: Diagnosis not present

## 2024-01-28 ENCOUNTER — Ambulatory Visit: Payer: BC Managed Care – PPO | Admitting: Family Medicine

## 2024-02-12 ENCOUNTER — Telehealth: Payer: Self-pay | Admitting: *Deleted

## 2024-02-12 NOTE — Telephone Encounter (Signed)
Called pt to remind him to bring cpap machine along with power cord to visit on 02/13/24. Pt verbalized he understood.

## 2024-02-13 ENCOUNTER — Ambulatory Visit: Payer: BC Managed Care – PPO | Admitting: Neurology

## 2024-02-13 ENCOUNTER — Encounter: Payer: Self-pay | Admitting: Neurology

## 2024-02-13 VITALS — BP 102/68 | HR 82 | Ht 71.0 in | Wt 210.0 lb

## 2024-02-13 DIAGNOSIS — G4733 Obstructive sleep apnea (adult) (pediatric): Secondary | ICD-10-CM

## 2024-02-13 NOTE — Patient Instructions (Signed)
Living With Sleep Apnea Sleep apnea is a condition that affects your breathing while you're sleeping. Your tongue or the tissue in your throat may block the flow of air while you sleep. You may have shallow breathing or stop breathing for short periods of time. The breaks in breathing interrupt the deep sleep that you need to feel rested. Even if you don't wake up from the gaps in breathing, you may feel tired during the day. People with sleep apnea may snore loudly. You may have a headache in the morning and feel anxious or depressed. How can sleep apnea affect me? Sleep apnea increases your chances of being very tired during the day. This is called daytime fatigue. Sleep apnea can also increase your risk of: Heart attack. Stroke. Obesity. Type 2 diabetes. Heart failure. Irregular heartbeat. High blood pressure. If you are very tired during the day, you may be more likely to: Not do well in school or at work. Fall asleep while driving. Have trouble paying attention. Develop depression or anxiety. Have problems having sex. This is called sexual dysfunction. What actions can I take to manage sleep apnea? Sleep apnea treatment  If you were given a device to open your airway while you sleep, use it only as told by your health care provider. You may be given: An oral appliance. This is a mouthpiece that shifts your lower jaw forward. A continuous positive airway pressure (CPAP) device. This blows air through a mask. A nasal expiratory positive airway pressure (EPAP) device. This has valves that you put into each nostril. A bi-level positive airway pressure (BIPAP) device. This blows air through a mask when you breathe in and breathe out. You may need surgery if other treatments don't work for you. Sleep habits Go to sleep and wake up at the same time every day. This helps set your internal clock for sleeping. If you stay up later than usual on weekends, try to get up in the morning within 2  hours of the time you usually wake up. Try to get at least 7-9 hours of sleep each night. Stop using a computer, tablet, and mobile phone a few hours before bedtime. Do not take long naps during the day. If you nap, limit it to 30 minutes. Have a relaxing bedtime routine. Reading or listening to music may relax you and help you sleep. Use your bedroom only for sleep. Keep your television and computer out of your bedroom. Keep your bedroom cool, dark, and quiet. Use a supportive mattress and pillows. Follow your provider's instructions for other changes to sleep habits. Nutrition Do not eat big meals in the evening. Do not have caffeine in the later part of the day. The effects of caffeine can last for more than 5 hours. Follow your provider's instructions for any changes to what you eat and drink. Lifestyle Do not drink alcohol before bedtime. Alcohol can cause you to fall asleep at first, but then it can cause you to wake up in the middle of the night and have trouble getting back to sleep. Do not smoke, vape, or use nicotine or tobacco. Medicines Take over-the-counter and prescription medicines only as told by your provider. Do not use over-the-counter sleep medicine. You may become dependent on this medicine, and it can make sleep apnea worse. Do not take medicines, such as sedatives and narcotics, unless told to by your provider. Activity Exercise on most days, but avoid exercising in the evening. Exercising near bedtime can interfere with sleeping.  If possible, spend time outside every day. Natural light helps with your internal clock. General information Lose weight if you need to. Stay at a healthy weight. If you are having surgery, make sure to tell your provider that you have sleep apnea. You may need to bring your device with you. Keep all follow-up visits. Your provider will want to check on your condition. Where to find more information National Heart, Lung, and Blood  Institute: [employer] This information is not intended to replace advice given to you by your health care provider. Make sure you discuss any questions you have with your health care provider. Document Revised: 04/10/2023 Document Reviewed: 04/10/2023 Elsevier Patient Education  2024 [employer].

## 2024-02-13 NOTE — Progress Notes (Signed)
Marland Kitchen

## 2024-02-13 NOTE — Progress Notes (Signed)
Provider:  Melvyn Novas, MD  Primary Care Physician:  [name], MD [address] [place] [address]     Referring Provider: [name], Md [address] [place],  [place] [address]          Chief Complaint according to patient   Patient presents with:     New Patient (Initial Visit)           HISTORY OF PRESENT ILLNESS: Question of CPAP replacement, set up date was 04-2014 !   Roberto Welch is a 63 y.o. male patient who is here for revisit 02/13/2024 for  OSA on CPAP .     07/25/22 ALL:  Roberto Welch is a 63 y.o. male here today for follow up for OSA on CPAP.  He was last seen by Dr Vickey Huger 02/2021 and reported difficulty tolerating CPAP. Repeat HST showed continued concerns of moderate OSA. Inspire versus CPAP discussed and he has continued CPAP. He admits that he continues to struggle meeting compliance. He does not not any significant benefit in using CPAP, however, he does report that his headaches have resolved. 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 He denies concerns with machine or supplies.    This patient was first seen in our sleep clinic in February 2015 and he underwent sleep testing at the time in the sleep lab.  I noted that his date of study was 02-05-2014 and he was diagnosed with moderate obstructive sleep apnea the overall AHI at the time was 18.6/h and he did have oxygen desaturations with a nadir to 75%.  REM sleep and supine REM sleep for the most apnea prone parts of his sleep.  He used CPAP at 10 cm water but had present central apneas present.  BiPAP was tried without adequate improvement and he returned for a full night study titration.  He was titrated to 9 cm water.    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  In 2020 it was documented that he had 100% PAP therapy compliance from January through February of that year.   In the meantime,he  feels like it is not helping as much and it is somewhat aggravating him- he does not like to get entangled in the headgear .  In 2021 he had skipped at least 1 week of CPAP use but now his compliance for the last 30 days has been around 70%. 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  His machine is set to 5 cm minimum pressure 12 cm maximum pressure and 1 cm expiratory pressure relief his residual apnea index is very good 2.2/h the residual apneas seem to be central in nature and that makes me concerned that this patient may have too much pressure available.  Of his 2.2 residual apneas 1.5 were indicated to be of central origin.  Pressure at the 95th percentile was 9.3 cmH2O.  he has not taken armodafinil for over a year- he had high blood pressures, and he felt no need for it.  He reports a lot of mask leaking , 95% leak at 22.5 l.  He has not gotten a new headgear or mask for a while.   Requesting retesting ( HST) and would consider INSPIRE if positive for  an apnea form that is likely to respond. He will have to document his compliance with any apnea treatment once a year.  Status post kidney stone surgery and hernia repair.   In April 2022 he had a home sleep test with [place], Calculated pAHI (per hour): 19.4                    REM pAHI: 23.8          NREM pAHI: 17.9       Supine AHI: 26.9                                                                                                                                                 Non- supine 8.9/h   Oxygen Saturation (%) Mean:            94                    Minimum oxygen saturation (%):         87         O2 Saturation Range (%): 87-99                    O2Saturation (minutes) <=88%:         0.1 min   Pulse Mean (bpm):                 65                    Pulse Range (45-101)  He states that the only CPAP ever used is the 1 he brought with him  today.      Review of Systems: Out of a complete 14 system review, the patient complains of only the following symptoms, and all other reviewed systems are negative.:  Fatigue, sleepiness , snoring,   Sleeps through the night   How likely are you to doze in the following situations: 0 = not likely, 1 = slight chance, 2 = moderate chance, 3 = high chance   Sitting and Reading? Watching Television? Sitting inactive in a public place (theater or meeting)? As a passenger in a car for an hour without a break? Lying down in the afternoon when circumstances permit? Sitting and talking to someone? Sitting quietly after lunch without alcohol? In a car, while stopped for a few minutes in traffic?   Total = 3/ 24 points   FSS endorsed at 11/ 63 points.   GDS 0 points.   Social History   Socioeconomic History   Marital status: Married    Spouse name: [name]   Number of children: 0   Years of education: 14   Highest education level: Not on file  Occupational History   Not on file  Tobacco Use   Smoking status: Never  Smokeless tobacco: Never  Vaping Use   Vaping status: Never Used  Substance and Sexual Activity   Alcohol use: No   Drug use: No   Sexual activity: Not on file  Other Topics Concern   Not on file  Social History Narrative   Patient is married ([name]) and lives at home with his wife.   Patient is working full-time.   Patient has a college education.   Patient is right-handed.   Patient drinks three cans of soda daily.   Social Drivers of Corporate investment banker Strain: Not on file  Food Insecurity: Not on file  Transportation Needs: Not on file  Physical Activity: Not on file  Stress: Not on file  Social Connections: Not on file    Family History  Problem Relation Age of Onset   Lung cancer Father    Colon polyps Neg Hx    Colon cancer Neg Hx    Pancreatic cancer Neg Hx    Rectal cancer Neg Hx    Stomach cancer Neg Hx    Sleep apnea Neg Hx      Past Medical History:  Diagnosis Date   Allergy    Arthritis    Diabetes mellitus without complication (HCC)    Hypertension    OSA on CPAP     Past Surgical History:  Procedure Laterality Date   ANKLE FRACTURE SURGERY  15 YRS AGO   RIGHT   CYSTOSCOPY WITH RETROGRADE PYELOGRAM, URETEROSCOPY AND STENT PLACEMENT Right 08/22/2018   Procedure: CYSTOSCOPY WITH RIGHT  RETROGRADE URETEROSCOPY LASER  LITHO AND STENT PLACEMENT;  Surgeon: Sebastian Ache, MD;  Location: [facility];  Service: Urology;  Laterality: Right;   HOLMIUM LASER APPLICATION Right 08/22/2018   Procedure: HOLMIUM LASER APPLICATION;  Surgeon: Sebastian Ache, MD;  Location: [facility];  Service: Urology;  Laterality: Right;   NASAL SEPTUM SURGERY  12 YRS AGO   ROTATOR CUFF REPAIR  01-22-11   LEFT   ROTATOR CUFF REPAIR  8 YRS AGO    RIGHT     Current Outpatient Medications on File Prior to Visit  Medication Sig Dispense Refill   JARDIANCE 25 MG TABS tablet Take 25 mg by mouth daily.     levothyroxine (SYNTHROID) 50 MCG tablet Take 50 mcg by mouth daily.     loratadine (CLARITIN) 10 MG tablet Take 1 tablet by mouth daily.     meloxicam (MOBIC) 15 MG tablet Take 15 mg by mouth daily.     metFORMIN (GLUCOPHAGE) 500 MG tablet Take 500 mg by mouth 2 (two) times daily with a meal.     pravastatin (PRAVACHOL) 40 MG tablet Take 40 mg by mouth daily.      telmisartan (MICARDIS) 40 MG tablet Take 40 mg by mouth daily.      TRADJENTA 5 MG TABS tablet Take 5 mg by mouth daily.     verapamil (CALAN-SR) 240 MG CR tablet Take 240 mg by mouth Daily.      No current facility-administered medications on file prior to visit.    Allergies  Allergen Reactions   Codeine Nausea And Vomiting    MIGRAINE HEADACHES   Morphine And Codeine Nausea And Vomiting    MIGRAINE HEADACHE     DIAGNOSTIC DATA (LABS, IMAGING, TESTING) - I reviewed patient records, labs, notes, testing and imaging myself where available.  Lab Results  Component Value  Date   WBC 6.2 08/20/2018   HGB 13.6 08/20/2018   HCT 40.5 08/20/2018  MCV 96.4 08/20/2018   PLT 225 08/20/2018      Component Value Date/Time   NA 141 08/20/2018 1445   K 4.5 08/20/2018 1445   CL 105 08/20/2018 1445   CO2 27 08/20/2018 1445   GLUCOSE 272 (H) 08/20/2018 1445   BUN 22 (H) 08/20/2018 1445   CREATININE 1.25 (H) 08/20/2018 1445   CALCIUM 9.5 08/20/2018 1445   GFRNONAA >60 08/20/2018 1445   GFRAA >60 08/20/2018 1445   No results found for: "CHOL", "HDL", "LDLCALC", "LDLDIRECT", "TRIG", "CHOLHDL" Lab Results  Component Value Date   HGBA1C 10.1 (H) 08/20/2018   No results found for: "VITAMINB12" No results found for: "TSH"  PHYSICAL EXAM:  Today's Vitals   02/13/24 1436  BP: 102/68  Pulse: 82  Weight: 210 lb (95.3 kg)  Height: 5\' 11"  (1.803 m)   Body mass index is 29.29 kg/m.   Wt Readings from Last 3 Encounters:  02/13/24 210 lb (95.3 kg)  07/25/22 201 lb (91.2 kg)  03/29/21 210 lb (95.3 kg)     Ht Readings from Last 3 Encounters:  02/13/24 5\' 11"  (1.803 m)  07/25/22 5\' 11"  (1.803 m)  03/29/21 5\' 11"  (1.803 m)      General: Body mass index is 29.29 kg/m.   Generalized: Well developed, in no acute distress  Head: normocephalic and atraumatic,. Oropharynx benign mallopatti 3.  Neck: Supple, no carotid bruits circumference 16.75 Lungs clear  Musculoskeletal: No deformity  Skin no rash or edema Neurological examination    Mentation: Alert oriented to time, place, history taking. Attention span and concentration appropriate. Recent and remote memory intact.  Follows all commands speech and language fluent.    Cranial nerve:  No loss of smell or taste.  Pupils were equal round reactive to light extraocular movements were full, visual field were full on confrontational test. Facial sensation and strength were normal. hearing was intact to finger rubbing bilaterally. Uvula tongue midline. head turning and shoulder shrug were normal and  symmetric.Tongue protrusion into cheek strength was normal. Motor: normal bulk and tone, Sensory: normal and symmetric to light touch,  Coordination: no changes in penmanship  Gait and Station: Rising up from seated position without assistance, normal stance,  moderate stride, good arm swing, smooth turning.  ASSESSMENT AND PLAN 63 y.o. year old male  here with:    1) OSA on CPAP, machine was set up in 04-2014 ,  and he uses it compliantly,  has low residual AHI .   2) does he need a new a machine? Not unless his machine is displaying the message of end of motor life.  3) HST repeat will be ordered for the summer and he will get a new CPAP when he needs and wants  a new machine.   I recommended to replace before medicare  kicks in.   I plan to follow up either personally or through our NP within 12 months.   I would like to thank [name], Md [address] [place],  [place] [address] for allowing me to meet with and to take care of this pleasant patient.     After spending a total time of  25  minutes face to face and additional time for physical and neurologic examination, review of laboratory studies,  personal review of imaging studies, reports and results of other testing and review of referral information / records as far as provided in visit,   Electronically signed by: Melvyn Novas, MD 02/13/2024 3:16 PM  [facility] and [employer] certified by [employer] of Sleep Medicine and Diplomate of the [employer] of Sleep Medicine. Board certified In Neurology through the ABPN, Fellow of the [employer] of Neu[employer].

## 2024-02-24 ENCOUNTER — Telehealth: Payer: Self-pay | Admitting: Adult Health

## 2024-02-24 NOTE — Telephone Encounter (Signed)
 Pt's wife rescheduled for a yearly appointment.

## 2024-03-03 DIAGNOSIS — E119 Type 2 diabetes mellitus without complications: Secondary | ICD-10-CM | POA: Diagnosis not present

## 2024-03-03 DIAGNOSIS — H40013 Open angle with borderline findings, low risk, bilateral: Secondary | ICD-10-CM | POA: Diagnosis not present

## 2024-03-05 ENCOUNTER — Telehealth: Payer: Self-pay

## 2024-03-05 NOTE — Telephone Encounter (Signed)
 Called patient to schedule HST ordered by Dr Vickey Huger. Wife called back stating that Patient is not interested in getting a new CPAP machine at this time. He is not having any issues with current machine and does not want to pay for a new machine also.

## 2024-03-31 DIAGNOSIS — E119 Type 2 diabetes mellitus without complications: Secondary | ICD-10-CM | POA: Diagnosis not present

## 2024-06-01 ENCOUNTER — Other Ambulatory Visit (HOSPITAL_COMMUNITY): Payer: Self-pay | Admitting: Internal Medicine

## 2024-06-01 DIAGNOSIS — R1319 Other dysphagia: Secondary | ICD-10-CM

## 2024-06-12 ENCOUNTER — Ambulatory Visit (HOSPITAL_COMMUNITY)
Admission: RE | Admit: 2024-06-12 | Discharge: 2024-06-12 | Disposition: A | Discharge status: Home / Self Care | Payer: Self-pay | Source: Ambulatory Visit | Attending: Internal Medicine | Admitting: Internal Medicine

## 2024-06-12 DIAGNOSIS — R1319 Other dysphagia: Secondary | ICD-10-CM | POA: Diagnosis not present

## 2024-06-12 DIAGNOSIS — K224 Dyskinesia of esophagus: Secondary | ICD-10-CM | POA: Diagnosis not present

## 2024-07-16 ENCOUNTER — Ambulatory Visit: Payer: BC Managed Care – PPO | Admitting: Neurology

## 2024-07-28 DIAGNOSIS — E1151 Type 2 diabetes mellitus with diabetic peripheral angiopathy without gangrene: Secondary | ICD-10-CM | POA: Diagnosis not present

## 2024-10-28 DIAGNOSIS — E119 Type 2 diabetes mellitus without complications: Secondary | ICD-10-CM | POA: Diagnosis not present

## 2025-02-11 ENCOUNTER — Ambulatory Visit: Payer: BC Managed Care – PPO | Admitting: Neurology
